# Patient Record
Sex: Male | Born: 1965 | Race: Black or African American | Hispanic: No | Marital: Married | Smoking: Current every day smoker
Health system: Southern US, Community
[De-identification: ages and names within clinical notes are randomized; demographics above are authoritative.]

## PROBLEM LIST (undated history)

## (undated) DIAGNOSIS — C801 Malignant (primary) neoplasm, unspecified: Secondary | ICD-10-CM

## (undated) DIAGNOSIS — K219 Gastro-esophageal reflux disease without esophagitis: Secondary | ICD-10-CM

---

## 1999-05-28 ENCOUNTER — Encounter: Payer: Self-pay | Admitting: Urology

## 1999-05-28 ENCOUNTER — Encounter: Admission: RE | Admit: 1999-05-28 | Discharge: 1999-05-28 | Payer: Self-pay | Admitting: Urology

## 2006-08-15 ENCOUNTER — Ambulatory Visit (HOSPITAL_BASED_OUTPATIENT_CLINIC_OR_DEPARTMENT_OTHER): Admission: RE | Admit: 2006-08-15 | Discharge: 2006-08-15 | Payer: Self-pay | Admitting: Orthopaedic Surgery

## 2008-02-12 ENCOUNTER — Emergency Department (HOSPITAL_COMMUNITY): Admission: EM | Admit: 2008-02-12 | Discharge: 2008-02-12 | Payer: Self-pay | Admitting: Emergency Medicine

## 2010-03-04 ENCOUNTER — Ambulatory Visit: Payer: Self-pay | Admitting: Family Medicine

## 2010-05-04 ENCOUNTER — Ambulatory Visit: Admit: 2010-05-04 | Payer: Self-pay | Admitting: Family Medicine

## 2010-09-17 NOTE — Op Note (Signed)
Corey Paul, Corey Paul                 ACCOUNT NO.:  [id number]   MEDICAL RECORD NO.:  [id number]          PATIENT TYPE:  AMB   LOCATION:  DSC                          FACILITY:  [facility]   PHYSICIAN:  Sharolyn Douglas, M.D.        DATE OF BIRTH:  July 16, 1965   DATE OF PROCEDURE:  08/15/2006  DATE OF DISCHARGE:                               OPERATIVE REPORT   DIAGNOSIS:  L5-S1 herniated nucleus pulposus and degenerative disk  disease with chronic back and left leg pain.   PROCEDURE:  1. Bilateral L5-S1 facette joint injections.  2. Left L5-S1 transforaminal epidural steroid injection.  3. Fluoroscopic imaging used for needle placement of the above      injections.   SURGEON:  Sharolyn Douglas, MD   ASSISTANT:  None.   ANESTHESIA:  MAC plus local.   COMPLICATIONS:  None.   INDICATIONS:  The patient is a pleasant 45 year old male with history of  HNP at L5-S1.  He has improved left lower extremity radiculopathy with  persistent back and buttock pain.  Now presents for diagnostic  potentially therapeutic injections as mentioned above.  Risk, benefits,  alternatives reviewed.   PROCEDURE:  After informed consent taken to the operating room.  He was  turned prone.  He underwent sedation by anesthesia.  Fluoroscopy brought  into the field.  The L5-S1 facette joints were visualized.  22 gauge  Quincke spinal needles were advanced into the L5-S1 facette joints  bilaterally.  Aspiration showed no blood.  Injection of 40 mg Depo-  Medrol and 1 mL preservative-free 1% lidocaine into each facette joint.  We then performed a left L5-S1 transforaminal epidural steroid  injection.  Oblique imaging was obtained of the L5-S1 segment.  22 gauge  Quincke spinal needle was advanced in the posterior oblique fashion  underneath the left L5 pedicle.  Aspiration showed no blood or CSF,  small injection of Omnipaque showed some spread along the epidural  space.  We then injected a solution of 80 mg Depo-Medrol and 3 mL  1%  preservative-free lidocaine.  The patient tolerated the procedure well.  No apparent complications.  Transferred to recovery in stable condition.  I reviewed post injection instructions with him.  He will follow-up in 2-  3 weeks.      Sharolyn Douglas, M.D.  Electronically Signed     MC/MEDQ  D:  08/15/2006  T:  08/15/2006  Job:  [id number]

## 2011-02-02 ENCOUNTER — Inpatient Hospital Stay (INDEPENDENT_AMBULATORY_CARE_PROVIDER_SITE_OTHER)
Admission: RE | Admit: 2011-02-02 | Discharge: 2011-02-02 | Disposition: A | Discharge status: Home / Self Care | Payer: Worker's Compensation | Source: Ambulatory Visit | Attending: Family Medicine | Admitting: Family Medicine

## 2011-02-02 DIAGNOSIS — M779 Enthesopathy, unspecified: Secondary | ICD-10-CM

## 2011-02-02 DIAGNOSIS — IMO0002 Reserved for concepts with insufficient information to code with codable children: Secondary | ICD-10-CM

## 2011-02-21 ENCOUNTER — Encounter: Payer: Self-pay | Admitting: Family Medicine

## 2012-06-19 ENCOUNTER — Telehealth: Payer: Self-pay | Admitting: Pediatrics

## 2012-06-19 NOTE — Telephone Encounter (Signed)
WRONG PATIENT

## 2012-06-19 NOTE — Telephone Encounter (Signed)
Been on meds for 10 days for strep throat has finished the meds and still says throat is sore and has a headache. Mom would

## 2013-11-13 ENCOUNTER — Encounter: Payer: Self-pay | Admitting: Family Medicine

## 2013-11-13 ENCOUNTER — Ambulatory Visit (INDEPENDENT_AMBULATORY_CARE_PROVIDER_SITE_OTHER): Payer: BC Managed Care – PPO | Admitting: Family Medicine

## 2013-11-13 ENCOUNTER — Ambulatory Visit: Payer: Self-pay | Admitting: Family Medicine

## 2013-11-13 VITALS — BP 122/84 | HR 109 | Wt 179.0 lb

## 2013-11-13 DIAGNOSIS — L723 Sebaceous cyst: Secondary | ICD-10-CM

## 2013-11-13 DIAGNOSIS — L259 Unspecified contact dermatitis, unspecified cause: Secondary | ICD-10-CM

## 2013-11-13 NOTE — Progress Notes (Signed)
   Subjective:    Patient ID: Corey Paul, male    DOB: 06/15/1965, 48 y.o.   MRN: [id number]  HPI He is here for evaluation of a lesion in his right axilla that has been there for several years. He also has a one-week history of palms and soles itching. Apparently this occurred after he did some yard work and was wearing flip-flops but no gloves.   Review of Systems     Objective:   Physical Exam Alert and in no distress. A 1-1/2 cm round smooth movable lesion is noted in the right axilla. Exam of his hands and feet does show a papular rash present on the dorsal and palmar surface of the hand. He also has a papular rash present on the dorsal aspect of the foot and questionably on the plantar surface. Lesion on the foot has a line of demarcation essentially lung the same line as his flip-flops.       Assessment & Plan:  Contact dermatitis  Sebaceous cyst  I explained that the rash on his foot was definitive in terms of the well demarcated area of the rash where the flip-flops did not cover. Recommend cortisone cream.

## 2013-11-13 NOTE — Patient Instructions (Signed)
Use cortisone cream on the rash.

## 2013-11-18 ENCOUNTER — Telehealth: Payer: Self-pay | Admitting: Internal Medicine

## 2013-11-18 MED ORDER — TRIAMCINOLONE ACETONIDE 0.1 % EX CREA: 1.0000 "application " | TOPICAL_CREAM | Freq: Two times a day (BID) | CUTANEOUS | Status: DC

## 2013-11-18 NOTE — Telephone Encounter (Signed)
Pt is still having itching and rash is still there. Pt has been using OTC hydrocortisone cream, and bendrayl like Dr. Redmond School recommended. Pharmacy is [facility] and [address] [facility]

## 2013-11-18 NOTE — Telephone Encounter (Signed)
Not getting any better. Still on hands and feet. Will send in med

## 2013-11-18 NOTE — Telephone Encounter (Signed)
Wife notified.

## 2013-11-18 NOTE — Telephone Encounter (Signed)
Confirm that rash has not spread (just hasn't gotten better--that it isn't spreading to new locations.  If still limited to the areas seen on recent visit, then okay for triamcinolone 0.1% cream, apply sparingly to affected areas of skin BID.  30 grams, no refill

## 2014-11-21 ENCOUNTER — Ambulatory Visit (INDEPENDENT_AMBULATORY_CARE_PROVIDER_SITE_OTHER): Payer: BLUE CROSS/BLUE SHIELD | Admitting: Family Medicine

## 2014-11-21 ENCOUNTER — Encounter: Payer: Self-pay | Admitting: Family Medicine

## 2014-11-21 VITALS — BP 130/80 | HR 94 | Wt 186.0 lb

## 2014-11-21 DIAGNOSIS — L089 Local infection of the skin and subcutaneous tissue, unspecified: Secondary | ICD-10-CM

## 2014-11-21 DIAGNOSIS — L723 Sebaceous cyst: Secondary | ICD-10-CM

## 2014-11-21 MED ORDER — HYDROCODONE-ACETAMINOPHEN 5-325 MG PO TABS: 1.0000 | ORAL_TABLET | Freq: Four times a day (QID) | ORAL | Status: DC | PRN

## 2014-11-21 NOTE — Patient Instructions (Signed)
Tryto leave the packing in for at least 2 days then remove it and irrigate it in the shower several times per day

## 2014-11-21 NOTE — Progress Notes (Signed)
   Subjective:    Patient ID: Corey Paul, male    DOB: 04/08/1966, 49 y.o.   MRN: [id number]  HPI  He is here for evaluation of a swollen and painful lesion in his right axilla. He has had a lump in that area for several years but in the last several days it has gotten larger and tender.   Review of Systems     Objective:   Physical Exam  alert and in no distress. Exam of the right axilla does show a 2 x 3 cm fluctuant tender lesion       Assessment & Plan:  Infected sebaceous cyst - Plan: HYDROcodone-acetaminophen (NORCO/VICODIN) 5-325 MG per tablet  wound was injected with Xylocaine. An I+ D  Was performed on it. Apparently material was removed. The cyst sac was also identified and manually removed . The wound did appear clear after that. It was packed with iodoform. They are to try to leave the packing in for 2 days and then remove it and irrigate the area.

## 2015-09-18 ENCOUNTER — Telehealth: Payer: Self-pay

## 2015-09-18 NOTE — Telephone Encounter (Signed)
Error

## 2016-08-30 ENCOUNTER — Ambulatory Visit (INDEPENDENT_AMBULATORY_CARE_PROVIDER_SITE_OTHER): Payer: BLUE CROSS/BLUE SHIELD | Admitting: Family Medicine

## 2016-08-30 ENCOUNTER — Encounter: Payer: Self-pay | Admitting: Family Medicine

## 2016-08-30 VITALS — BP 138/88 | HR 72 | Temp 97.7°F | Wt 182.0 lb

## 2016-08-30 DIAGNOSIS — J309 Allergic rhinitis, unspecified: Secondary | ICD-10-CM | POA: Diagnosis not present

## 2016-08-30 DIAGNOSIS — H9202 Otalgia, left ear: Secondary | ICD-10-CM

## 2016-08-30 DIAGNOSIS — F17290 Nicotine dependence, other tobacco product, uncomplicated: Secondary | ICD-10-CM | POA: Diagnosis not present

## 2016-08-30 NOTE — Patient Instructions (Signed)
Call 800 quit now Right down your plans for what you're going to do instead of smoking a cigar and carry them with you Try loratadine and also Rhinocort nasal spray

## 2016-08-30 NOTE — Progress Notes (Signed)
   Subjective:    Patient ID: Corey Paul, male    DOB: 1965/07/12, 51 y.o.   MRN: [id number]  HPI He is here for evaluation of a 6 week history of left ear congestion as well as some pain and occasional dizziness. No fever, chills, sore throat, cough or chest congestion PND. He does complain of some sneezing as well as itchy watery eyes. He does note that when he yawns and pops his ears, the left ear symptoms do tend to get better. He does smoke cigars and plans to quit when he has his next birthday. He apparently smokes one cigar a day.   Review of Systems     Objective:   Physical Exam Alert and in no distress. Tympanic membranes and canals are normal. Pharyngeal area is normal. Neck is supple without adenopathy or thyromegaly. Cardiac exam shows a regular sinus rhythm without murmurs or gallops. Lungs are clear to auscultation.        Assessment & Plan:  Allergic rhinitis, unspecified seasonality, unspecified trigger  Left ear pain  Cigar smoker motivated to quit I explained that I thought ear pain was related to allergies and eustachian tube dysfunction. Recommend using an antihistamine as well as Rhinocort or Flonase nasal spray. He will let me know how this works and reevaluate at that point or possibly refer to ENT. I then discussed stopping his cigar smoking. 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 Explained the fact that he needs to look at when he smokes a cigar and come up with positive things to do instead of that. Recommend he call 800 quit now. Return here as needed. Over 25 minutes, greater than 50% spent in counseling and coordination of care.

## 2018-07-18 ENCOUNTER — Other Ambulatory Visit: Payer: Self-pay | Admitting: Family Medicine

## 2018-07-18 ENCOUNTER — Other Ambulatory Visit: Payer: Self-pay

## 2018-07-18 ENCOUNTER — Ambulatory Visit: Payer: Self-pay

## 2018-07-18 DIAGNOSIS — M542 Cervicalgia: Secondary | ICD-10-CM

## 2019-03-11 ENCOUNTER — Other Ambulatory Visit: Payer: Self-pay

## 2019-03-11 DIAGNOSIS — Z20822 Contact with and (suspected) exposure to covid-19: Secondary | ICD-10-CM

## 2019-03-12 LAB — NOVEL CORONAVIRUS, NAA: SARS-CoV-2, NAA: NOT DETECTED
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
## 2021-07-12 DIAGNOSIS — E7800 Pure hypercholesterolemia, unspecified: Secondary | ICD-10-CM | POA: Diagnosis not present

## 2021-10-18 DIAGNOSIS — N400 Enlarged prostate without lower urinary tract symptoms: Secondary | ICD-10-CM | POA: Diagnosis not present

## 2021-10-25 DIAGNOSIS — R972 Elevated prostate specific antigen [PSA]: Secondary | ICD-10-CM | POA: Diagnosis not present

## 2021-10-25 DIAGNOSIS — N400 Enlarged prostate without lower urinary tract symptoms: Secondary | ICD-10-CM | POA: Diagnosis not present

## 2022-02-08 DIAGNOSIS — Z0000 Encounter for general adult medical examination without abnormal findings: Secondary | ICD-10-CM | POA: Diagnosis not present

## 2022-02-08 DIAGNOSIS — E7800 Pure hypercholesterolemia, unspecified: Secondary | ICD-10-CM | POA: Diagnosis not present

## 2022-03-28 DIAGNOSIS — N400 Enlarged prostate without lower urinary tract symptoms: Secondary | ICD-10-CM | POA: Diagnosis not present

## 2022-04-04 DIAGNOSIS — N400 Enlarged prostate without lower urinary tract symptoms: Secondary | ICD-10-CM | POA: Diagnosis not present

## 2022-04-04 DIAGNOSIS — R972 Elevated prostate specific antigen [PSA]: Secondary | ICD-10-CM | POA: Diagnosis not present

## 2022-05-16 DIAGNOSIS — C61 Malignant neoplasm of prostate: Secondary | ICD-10-CM | POA: Diagnosis not present

## 2022-05-16 DIAGNOSIS — R972 Elevated prostate specific antigen [PSA]: Secondary | ICD-10-CM | POA: Diagnosis not present

## 2022-05-23 DIAGNOSIS — C61 Malignant neoplasm of prostate: Secondary | ICD-10-CM | POA: Diagnosis not present

## 2022-05-25 ENCOUNTER — Telehealth: Payer: Self-pay | Admitting: Radiation Oncology

## 2022-05-25 NOTE — Telephone Encounter (Signed)
LVM to schedule CON with Dr. Tammi Klippel

## 2022-06-06 ENCOUNTER — Encounter: Payer: Self-pay | Admitting: Urology

## 2022-06-06 DIAGNOSIS — C61 Malignant neoplasm of prostate: Secondary | ICD-10-CM | POA: Insufficient documentation

## 2022-06-06 NOTE — Progress Notes (Signed)
GU Location of Tumor / Histology: Prostate Ca  If Prostate Cancer, Gleason Score is (3 + 4) and PSA is (5.42 on 03/2022)  Biopsies     Past/Anticipated interventions by urology, if any:     Past/Anticipated interventions by medical oncology, if any:  NA  Weight changes, if any: {:18581}  IPSS: SHIM:  Bowel/Bladder complaints, if any: {:18581}   Nausea/Vomiting, if any: {:18581}  Pain issues, if any:  {:18581}  SAFETY ISSUES: Prior radiation? {:18581} Pacemaker/ICD? {:18581} Possible current pregnancy? Male Is the patient on methotrexate? No  Current Complaints / other details:

## 2022-06-06 NOTE — Progress Notes (Signed)
Radiation Oncology         [phone] ________________________________  Initial Outpatient Consultation  Name: Corey Paul MRN: [id number]  Date: 06/07/2022  DOB: [birth year]  OA:[id number], Elyse Jarvis, MD  Remi Haggard, MD   REFERRING PHYSICIAN: Remi Haggard, MD  DIAGNOSIS: 57 y.o. gentleman with Stage T1c adenocarcinoma of the prostate with Gleason score of 3+4, and PSA of 5.4.  No diagnosis found.  HISTORY OF PRESENT ILLNESS: Corey Paul is a 57 y.o. male with a diagnosis of prostate cancer. He was initially referred to Dr. Milford Cage in mid 2022 for a marginally elevated PSA of 4.43. His PSA subsequently normalized to 2.56 in 04/2021. Unfortunately, his PSA began to rise again, to 3.67 in 09/2021 and 5.42 in 03/2022. The patient proceeded to transrectal ultrasound with 12 biopsies of the prostate on 05/16/22.  The prostate volume measured 36.75 cc.  Out of 12 core biopsies, 5 were positive.  The maximum Gleason score was 3+4, and this was seen in left base lateral. Additionally, small foci of Gleason 3+3 were seen in right mid lateral, right base lateral, left mid lateral, and left base.  The patient reviewed the biopsy results with his urologist and he has kindly been referred today for discussion of potential radiation treatment options.   PREVIOUS RADIATION THERAPY: No  PAST MEDICAL HISTORY: No past medical history on file.    PAST SURGICAL HISTORY:No past surgical history on file.  FAMILY HISTORY: No family history on file.  SOCIAL HISTORY:  Social History   Socioeconomic History   Marital status: Married    Spouse name: Not on file   Number of children: Not on file   Years of education: Not on file   Highest education level: Not on file  Occupational History   Not on file  Tobacco Use   Smoking status: Light Smoker   Smokeless tobacco: Not on file  Substance and Sexual Activity   Alcohol use: Not on file   Drug use: Not on file   Sexual activity: Not on file   Other Topics Concern   Not on file  Social History Narrative   Not on file   Social Determinants of Health   Financial Resource Strain: Not on file  Food Insecurity: Not on file  Transportation Needs: Not on file  Physical Activity: Not on file  Stress: Not on file  Social Connections: Not on file  Intimate Partner Violence: Not on file    ALLERGIES: Patient has no known allergies.  MEDICATIONS:  Current Outpatient Medications  Medication Sig Dispense Refill   HYDROcodone-acetaminophen (NORCO/VICODIN) 5-325 MG per tablet Take 1 tablet by mouth every 6 (six) hours as needed for moderate pain. ONLY PRN 20 tablet 0   methocarbamol (ROBAXIN) 500 MG tablet Take 500 mg by mouth every 8 (eight) hours as needed for muscle spasms. ONLY PRN     Multiple Vitamins-Minerals (MULTIVITAMIN WITH MINERALS) tablet Take 1 tablet by mouth daily.     TRAZODONE HCL PO Take by mouth.     No current facility-administered medications for this encounter.    REVIEW OF SYSTEMS:  On review of systems, the patient reports that he is doing well overall. He denies any chest pain, shortness of breath, cough, fevers, chills, night sweats, unintended weight changes. He denies any bowel disturbances, and denies abdominal pain, nausea or vomiting. He denies any new musculoskeletal or joint aches or pains. His IPSS was ***, indicating *** urinary symptoms. His SHIM was ***,  indicating he {does not have/has mild/moderate/severe} erectile dysfunction. A complete review of systems is obtained and is otherwise negative.    PHYSICAL EXAM:  Wt Readings from Last 3 Encounters:  08/30/16 182 lb (82.6 kg)  11/21/14 186 lb (84.4 kg)  11/13/13 179 lb (81.2 kg)   Temp Readings from Last 3 Encounters:  08/30/16 97.7 F (36.5 C) (Oral)   BP Readings from Last 3 Encounters:  08/30/16 138/88  11/21/14 130/80  11/13/13 122/84   Pulse Readings from Last 3 Encounters:  08/30/16 72  11/21/14 94  11/13/13 (!) 109     /10  In general this is a well appearing *** male in no acute distress. He's alert and oriented x4 and appropriate throughout the examination. Cardiopulmonary assessment is negative for acute distress, and he exhibits normal effort.     KPS = ***  100 - Normal; no complaints; no evidence of disease. 90   - Able to carry on normal activity; minor signs or symptoms of disease. 80   - Normal activity with effort; some signs or symptoms of disease. 100   - Cares for self; unable to carry on normal activity or to do active work. 60   - Requires occasional assistance, but is able to care for most of his personal needs. 50   - Requires considerable assistance and frequent medical care. 55   - Disabled; requires special care and assistance. 2   - Severely disabled; hospital admission is indicated although death not imminent. 78   - Very sick; hospital admission necessary; active supportive treatment necessary. 10   - Moribund; fatal processes progressing rapidly. 0     - Dead  Karnofsky DA, Abelmann WH, Craver LS and Burchenal JH [phone]) The use of the nitrogen mustards in the palliative treatment of carcinoma: with particular reference to bronchogenic carcinoma Cancer 1 634-56  LABORATORY DATA:  No results found for: "WBC", "HGB", "HCT", "MCV", "PLT" No results found for: "NA", "K", "CL", "CO2" No results found for: "ALT", "AST", "GGT", "ALKPHOS", "BILITOT"   RADIOGRAPHY: No results found.    IMPRESSION/PLAN: 1. 57 y.o. gentleman with Stage T1c adenocarcinoma of the prostate with Gleason Score of 3+4, and PSA of 5.4. We discussed the patient's workup and outlined the nature of prostate cancer in this setting. The patient's T stage, Gleason's score, and PSA put him into the favorable intermediate risk group. Accordingly, he is eligible for a variety of potential treatment options including brachytherapy, 5.5 weeks of external radiation, or prostatectomy. We discussed the available radiation  techniques, and focused on the details and logistics of delivery. We discussed and outlined the risks, benefits, short and long-term effects associated with radiotherapy and compared and contrasted these with prostatectomy. We discussed the role of SpaceOAR gel in reducing the rectal toxicity associated with radiotherapy. He appears to have a good understanding of his disease and our treatment recommendations which are of curative intent.  He was encouraged to ask questions that were answered to his stated satisfaction.  At the conclusion of our conversation, the patient is interested in moving forward with ***.  We personally spent *** minutes in this encounter including chart review, reviewing radiological studies, meeting face-to-face with the patient, entering orders and completing documentation.    Nicholos Johns, PA-C    Tyler Pita, MD  [facility] Direct Dial: [phone]  Fax: [phone] [email or web address]  Skype  [email or web address]   This document serves as a record of services personally performed by Rodman Key  Tammi Klippel, MD and [employer], PA-C. It was created on their behalf by Wilburn Mylar, a trained medical scribe. The creation of this record is based on the scribe's personal observations and the provider's statements to them. This document has been checked and approved by the attending provider.

## 2022-06-07 ENCOUNTER — Ambulatory Visit
Admission: RE | Admit: 2022-06-07 | Discharge: 2022-06-07 | Disposition: A | Discharge status: Home / Self Care | Payer: BC Managed Care – PPO | Source: Ambulatory Visit | Attending: Radiation Oncology | Admitting: Radiation Oncology

## 2022-06-07 ENCOUNTER — Other Ambulatory Visit: Payer: Self-pay

## 2022-06-07 VITALS — BP 131/92 | HR 85 | Temp 97.9°F | Resp 20 | Ht 68.0 in | Wt 191.4 lb

## 2022-06-07 DIAGNOSIS — Z79899 Other long term (current) drug therapy: Secondary | ICD-10-CM | POA: Diagnosis not present

## 2022-06-07 DIAGNOSIS — C61 Malignant neoplasm of prostate: Secondary | ICD-10-CM

## 2022-06-07 DIAGNOSIS — Z191 Hormone sensitive malignancy status: Secondary | ICD-10-CM | POA: Diagnosis not present

## 2022-06-07 NOTE — Progress Notes (Signed)
Introduced myself to the patient, and his wife, as the prostate nurse navigator.  He is here to discuss his radiation treatment options. Patient has a surgical consult on 2/20 with Dr. Jiles Prows patient is agreeable for me to follow up after to ensure treatment decision is finalized.  I gave him my business card and asked him to call me with questions or concerns.  Verbalized understanding.

## 2022-06-23 NOTE — Progress Notes (Signed)
Patient with RadOnc Consult on 06/07/2022 for his stage T1c adenocarcinoma of the prostate with Gleason score of 3+4, and PSA of 5.4.   Patient was scheduled for surgical consult @ [facility] on 2/20, however, did not show for appointment.   RN left voicemail requesting call back to assess navigation needs/treatment decision.

## 2022-06-27 NOTE — Progress Notes (Signed)
Patient confirmed he would like to proceed with the recommendations of 5.5 weeks of IMRT.  MD's notified.  RN educated patient on next steps consisting of obtaining date for fiducial's, spaceOAR, and CT Simulation.  Verbalized understanding.   Plan of care in progress.

## 2022-06-28 ENCOUNTER — Other Ambulatory Visit: Payer: Self-pay | Admitting: Urology

## 2022-07-11 DIAGNOSIS — C61 Malignant neoplasm of prostate: Secondary | ICD-10-CM | POA: Diagnosis not present

## 2022-07-18 ENCOUNTER — Encounter (HOSPITAL_BASED_OUTPATIENT_CLINIC_OR_DEPARTMENT_OTHER): Payer: Self-pay | Admitting: Urology

## 2022-07-18 NOTE — Progress Notes (Signed)
Spoke w/ via phone for pre-op interview--- [place]---- NONE              Lab results------ COVID test -----patient states asymptomatic no test needed Arrive at -------0600 NPO after MN NO Solid Food.  Clear liquids from MN until---0500 Med rec completed Medications to take morning of surgery -----Nexium and Norco PRN Diabetic medication ----- Patient instructed no nail polish to be worn day of surgery Patient instructed to bring photo id and insurance card day of surgery Patient aware to have Driver (ride ) / caregiver  Wife Aayam Tiscareno  for 24 hours after surgery  Patient Special Instructions ----- Pre-Op special Istructions -----FLEETS enema AM of procedure patient verbalized understanding. Patient verbalized understanding of instructions that were given at this phone interview. Patient denies shortness of breath, chest pain, fever, cough at this phone interview.

## 2022-07-22 ENCOUNTER — Ambulatory Visit (HOSPITAL_BASED_OUTPATIENT_CLINIC_OR_DEPARTMENT_OTHER): Payer: BC Managed Care – PPO | Admitting: Anesthesiology

## 2022-07-22 ENCOUNTER — Encounter (HOSPITAL_BASED_OUTPATIENT_CLINIC_OR_DEPARTMENT_OTHER)
Admission: RE | Disposition: A | Discharge status: Home / Self Care | Payer: Self-pay | Source: Home / Self Care | Attending: Urology

## 2022-07-22 ENCOUNTER — Ambulatory Visit (HOSPITAL_BASED_OUTPATIENT_CLINIC_OR_DEPARTMENT_OTHER)
Admission: RE | Admit: 2022-07-22 | Discharge: 2022-07-22 | Disposition: A | Discharge status: Home / Self Care | Payer: BC Managed Care – PPO | Attending: Urology | Admitting: Urology

## 2022-07-22 ENCOUNTER — Encounter (HOSPITAL_BASED_OUTPATIENT_CLINIC_OR_DEPARTMENT_OTHER): Payer: Self-pay | Admitting: Urology

## 2022-07-22 ENCOUNTER — Other Ambulatory Visit: Payer: Self-pay

## 2022-07-22 DIAGNOSIS — C61 Malignant neoplasm of prostate: Secondary | ICD-10-CM | POA: Diagnosis not present

## 2022-07-22 DIAGNOSIS — Z8546 Personal history of malignant neoplasm of prostate: Secondary | ICD-10-CM | POA: Diagnosis not present

## 2022-07-22 DIAGNOSIS — F17200 Nicotine dependence, unspecified, uncomplicated: Secondary | ICD-10-CM | POA: Diagnosis not present

## 2022-07-22 DIAGNOSIS — E785 Hyperlipidemia, unspecified: Secondary | ICD-10-CM | POA: Diagnosis not present

## 2022-07-22 HISTORY — DX: Malignant (primary) neoplasm, unspecified: C80.1

## 2022-07-22 HISTORY — DX: Gastro-esophageal reflux disease without esophagitis: K21.9

## 2022-07-22 HISTORY — PX: SPACE OAR INSTILLATION: SHX6769

## 2022-07-22 HISTORY — PX: GOLD SEED IMPLANT: SHX6343

## 2022-07-22 SURGERY — INSERTION, GOLD SEEDS
Anesthesia: Monitor Anesthesia Care | Site: Prostate

## 2022-07-22 MED ORDER — CIPROFLOXACIN IN D5W 400 MG/200ML IV SOLN
INTRAVENOUS | Status: AC
  Filled 2022-07-22: qty 200

## 2022-07-22 MED ORDER — ACETAMINOPHEN 500 MG PO TABS
ORAL_TABLET | ORAL | Status: AC
  Filled 2022-07-22: qty 2

## 2022-07-22 MED ORDER — LIDOCAINE HCL 1 % IJ SOLN
INTRAMUSCULAR | Status: DC | PRN
  Administered 2022-07-22: 10 mL

## 2022-07-22 MED ORDER — FLEET ENEMA 7-19 GM/118ML RE ENEM: 1.0000 | ENEMA | Freq: Once | RECTAL | Status: DC

## 2022-07-22 MED ORDER — MIDAZOLAM HCL 5 MG/5ML IJ SOLN
INTRAMUSCULAR | Status: DC | PRN
  Administered 2022-07-22: 2 mg via INTRAVENOUS

## 2022-07-22 MED ORDER — ONDANSETRON HCL 4 MG/2ML IJ SOLN
INTRAMUSCULAR | Status: DC | PRN
  Administered 2022-07-22: 4 mg via INTRAVENOUS

## 2022-07-22 MED ORDER — PROPOFOL 10 MG/ML IV BOLUS
INTRAVENOUS | Status: AC
  Filled 2022-07-22: qty 20

## 2022-07-22 MED ORDER — PROPOFOL 500 MG/50ML IV EMUL
INTRAVENOUS | Status: AC
  Filled 2022-07-22: qty 50

## 2022-07-22 MED ORDER — ACETAMINOPHEN 500 MG PO TABS
1000.0000 mg | ORAL_TABLET | Freq: Once | ORAL | Status: AC
  Administered 2022-07-22: 1000 mg via ORAL

## 2022-07-22 MED ORDER — FENTANYL CITRATE (PF) 100 MCG/2ML IJ SOLN
INTRAMUSCULAR | Status: AC
  Filled 2022-07-22: qty 2

## 2022-07-22 MED ORDER — PROPOFOL 10 MG/ML IV BOLUS
INTRAVENOUS | Status: DC | PRN
  Administered 2022-07-22: 200 ug/kg/min via INTRAVENOUS
  Administered 2022-07-22 (×2): 20 mg via INTRAVENOUS

## 2022-07-22 MED ORDER — MIDAZOLAM HCL 2 MG/2ML IJ SOLN
INTRAMUSCULAR | Status: AC
  Filled 2022-07-22: qty 2

## 2022-07-22 MED ORDER — CIPROFLOXACIN IN D5W 400 MG/200ML IV SOLN
400.0000 mg | INTRAVENOUS | Status: AC
  Administered 2022-07-22: 400 mg via INTRAVENOUS

## 2022-07-22 MED ORDER — GLYCOPYRROLATE PF 0.2 MG/ML IJ SOSY
PREFILLED_SYRINGE | INTRAMUSCULAR | Status: DC | PRN
  Administered 2022-07-22: .1 mg via INTRAVENOUS

## 2022-07-22 MED ORDER — FENTANYL CITRATE (PF) 100 MCG/2ML IJ SOLN
INTRAMUSCULAR | Status: DC | PRN
  Administered 2022-07-22: 50 ug via INTRAVENOUS

## 2022-07-22 MED ORDER — DEXMEDETOMIDINE HCL IN NACL 200 MCG/50ML IV SOLN
INTRAVENOUS | Status: DC | PRN
  Administered 2022-07-22: 8 ug via INTRAVENOUS

## 2022-07-22 MED ORDER — GLYCOPYRROLATE PF 0.2 MG/ML IJ SOSY
PREFILLED_SYRINGE | INTRAMUSCULAR | Status: AC
  Filled 2022-07-22: qty 1

## 2022-07-22 MED ORDER — FENTANYL CITRATE (PF) 100 MCG/2ML IJ SOLN: 25.0000 ug | INTRAMUSCULAR | Status: DC | PRN

## 2022-07-22 MED ORDER — LACTATED RINGERS IV SOLN
INTRAVENOUS | Status: DC
  Administered 2022-07-22: 1000 mL via INTRAVENOUS

## 2022-07-22 MED ORDER — SODIUM CHLORIDE (PF) 0.9 % IJ SOLN
INTRAMUSCULAR | Status: DC | PRN
  Administered 2022-07-22: 10 mL

## 2022-07-22 SURGICAL SUPPLY — 25 items
BLADE CLIPPER SENSICLIP SURGIC (BLADE) ×2 IMPLANT
CNTNR URN SCR LID CUP LEK RST (MISCELLANEOUS) ×2 IMPLANT
CONT SPEC 4OZ STRL OR WHT (MISCELLANEOUS) ×2
COVER BACK TABLE 60X90IN (DRAPES) ×2 IMPLANT
DRSG TEGADERM 4X4.75 (GAUZE/BANDAGES/DRESSINGS) ×2 IMPLANT
DRSG TEGADERM 8X12 (GAUZE/BANDAGES/DRESSINGS) ×2 IMPLANT
GAUZE SPONGE 4X4 12PLY STRL (GAUZE/BANDAGES/DRESSINGS) ×2 IMPLANT
GLOVE BIO SURGEON STRL SZ7.5 (GLOVE) ×2 IMPLANT
GLOVE SURG ORTHO 8.5 STRL (GLOVE) ×2 IMPLANT
IMPL SPACEOAR VUE SYSTEM (Spacer) ×2 IMPLANT
IMPLANT SPACEOAR VUE SYSTEM (Spacer) ×2 IMPLANT
KIT TURNOVER CYSTO (KITS) ×2 IMPLANT
MARKER GOLD PRELOAD 1.2X3 (Urological Implant) ×2 IMPLANT
MARKER SKIN DUAL TIP RULER LAB (MISCELLANEOUS) ×2 IMPLANT
NDL SPNL 22GX3.5 QUINCKE BK (NEEDLE) ×2 IMPLANT
NEEDLE SPNL 22GX3.5 QUINCKE BK (NEEDLE) ×2 IMPLANT
SEED GOLD PRELOAD 1.2X3 (Urological Implant) ×2 IMPLANT
SHEATH ULTRASOUND LF (SHEATH) IMPLANT
SHEATH ULTRASOUND LTX NONSTRL (SHEATH) IMPLANT
SLEEVE SCD COMPRESS KNEE MED (STOCKING) ×2 IMPLANT
SURGILUBE 2OZ TUBE FLIPTOP (MISCELLANEOUS) ×2 IMPLANT
SYR 10ML LL (SYRINGE) ×2 IMPLANT
SYR CONTROL 10ML LL (SYRINGE) ×2 IMPLANT
TOWEL OR 17X24 6PK STRL BLUE (TOWEL DISPOSABLE) ×2 IMPLANT
UNDERPAD 30X36 HEAVY ABSORB (UNDERPADS AND DIAPERS) ×2 IMPLANT

## 2022-07-22 NOTE — Discharge Instructions (Addendum)
For several days the patient:  should increase his fluid intake and limit strenuous activity. he might have mild discomfort at the base of his penis or in his rectum. he might have blood in his urine or blood in his bowel movements.  For 2-3 months he might have blood in his ejaculate (semen).  Call the office immedicately:  for blood clots in the urine or bowel movements,  difficulty urinating,  inability to urinate,  urinary retention,  painful or frequent urination,  fever, chills,  nausea, vomiting, other illness.    [facility]:  [phone]  Post Anesthesia Home Care Instructions  Activity: Get plenty of rest for the remainder of the day. A responsible adult should stay with you for 24 hours following the procedure.  For the next 24 hours, DO NOT: -Drive a car -Paediatric nurse -Drink alcoholic beverages -Take any medication unless instructed by your physician -Make any legal decisions or sign important papers.  Meals: Start with liquid foods such as gelatin or soup. Progress to regular foods as tolerated. Avoid greasy, spicy, heavy foods. If nausea and/or vomiting occur, drink only clear liquids until the nausea and/or vomiting subsides. Call your physician if vomiting continues.  Special Instructions/Symptoms: Your throat may feel dry or sore from the anesthesia or the breathing tube placed in your throat during surgery. If this causes discomfort, gargle with warm salt water. The discomfort should disappear within 24 hours.  May take tylenol after 3pm if needed for discomfort.

## 2022-07-22 NOTE — Op Note (Signed)
Preoperative diagnosis: Clinically localized adenocarcinoma of the prostate    Postoperative diagnosis: Clinically localized adenocarcinoma of the prostate  Procedure: 1) Placement of fiducial markers into prostate                    2) Insertion of SpaceOAR hydrogel   Surgeon: Louis Meckel, M.D.  Anesthesia: General  EBL: Minimal  Complications: None  Indication: Corey Paul is a 57 y.o. gentleman with clinically localized prostate cancer. After discussing management options for treatment, he elected to proceed with radiotherapy. He presents today for the above procedures. The potential risks, complications, alternative options, and expected recovery course have been discussed in detail with the patient and he has provided informed consent to proceed.  Description of procedure: The patient was administered preoperative antibiotics, placed in the dorsal lithotomy position, and prepped and draped in the usual sterile fashion. Next, transrectal ultrasonography was utilized to visualize the prostate. Three gold fiducial markers were then placed into the prostate via transperineal needles under ultrasound guidance at the right apex, right base, and left mid gland under direct ultrasound guidance. A site in the midline was then selected on the perineum for placement of an 18 g needle with saline. The needle was advanced above the rectum and below Denonvillier's fascia to the mid gland and confirmed to be in the midline on transverse imaging. One cc of saline was injected confirming appropriate expansion of this space. A total of 5 cc of saline was then injected to open the space further bilaterally. The saline syringe was then removed and the SpaceOAR hydrogel was injected with good distribution bilaterally. He tolerated the procedure well and without complications. He was given a voiding trial prior to discharge from the PACU.

## 2022-07-22 NOTE — Transfer of Care (Signed)
Immediate Anesthesia Transfer of Care Note  Patient: Corey Paul  Procedure(s) Performed: GOLD SEED IMPLANT (Perineum) SPACE OAR INSTILLATION (Prostate)  Patient Location: PACU  Anesthesia Type:MAC  Level of Consciousness: drowsy, patient cooperative, and responds to stimulation  Airway & Oxygen Therapy: Patient Spontanous Breathing and Patient connected to face mask oxygen  Post-op Assessment: Report given to RN and Post -op Vital signs reviewed and stable  Post vital signs: Reviewed and stable  Last Vitals:  Vitals Value Taken Time  BP 125/91 07/22/22 0902  Temp    Pulse 98 07/22/22 0904  Resp 15 07/22/22 0904  SpO2 100 % 07/22/22 0904  Vitals shown include unvalidated device data.  Last Pain:  Vitals:   07/22/22 0639  TempSrc:   PainSc: 0-No pain      Patients Stated Pain Goal: 5 ([id number])  Complications: No notable events documented.

## 2022-07-22 NOTE — H&P (Signed)
Patient is a healthy 57 year old African American male seen today for evaluation of marginal PSA elevation noted on recent routine physical exam. Patient noted to have PSA of 4.90 on 07/01/2020. This was repeated on 07/14/2020 was 4.43. There is family history of prostate cancer with father being diagnosed in his 41s and underwent treatment with surgery by his report. Patient's father is alive and well. The patient has minimal if any voiding symptoms. Here for evaluation  -04/12/21-patient with history of marginal PSA elevation noted to be 4.43 back in March 2022 we opted for 69-month surveillance. His most recent PSA is now normalized to 2.56 on 04/05/2021. Here to discuss next steps of management no interim GU issues.  Micro urinalysis is clear and urine spun sediment  -10/25/21 patient with history of marginal PSA elevation noted to be 4.90 back in March 2022 and we opted for 29-month surveillance. His most recent PSA is 3.67 on 10/18/2021 up from 2.56 on 04/05/2021. No interim voiding issues.  Micro urinalysis is clear on urine spun sediment  -04/04/22-patient with history of marginal PSA elevation up to 4.90 back in March 22 and we opted for surveillance. Most recent PSA is 5.42 on 03/28/2022. This has been a progressive increase over the last 2 values from 3.67 in June 2023 and up from 2.56 in December 2022. No voiding issues.  Micro urinalysis  -05/16/22-patient with history of marginal PSA elevation up to 5.42. Here for transrectal ultrasound prostate biopsy.  -05/23/22-patient with history of marginal PSA elevation up to 5.42. Underwent transrectal ultrasound prostate biopsy 05/16/2022 which unfortunately shows adenocarcinoma the prostate and 5 out of 12 cores. See below. Patient had no significant postprocedural issues.  TRUS/BX 05/16/22  PATH:  1. Left base lateral prostate adenocarcinoma, Gleason score 3+4 equal 7 (grade group 2) involves 10% of 1 core  2. Left mid lateral small focus prostate  adenocarcinoma, Gleason score 3+3 equal 6 involving 5% of 1 core  3. Left base small focus prostate adenocarcinoma, Gleason score 3+3 equal 6 involving less than 5% of 1 core  4. Right base lateral small focus prostate adenocarcinoma, Gleason score 3+3 equal 6 involving 5% of 1 core  5. Right mid lateral small focus prostate adenocarcinoma, Gleason score 3+3 equal 6 involving 5% of 1 core  NCCN:Favorable Intermediate   -07/11/22-patient with history of favorable intermediate adenocarcinoma the prostate as above. Met with radiation oncology and opted for IMRT. Now for preop evaluation for fiducial marker and SpaceOAR gel implant. No interim GU issues  Micro urinalysis is clear on urine spun sediment     ALLERGIES: None   MEDICATIONS: Nexium  Valium 10 mg tablet Take 1 tab po 1 hour prior to procedure  Claritin  Hydrocodone-Acetaminophen 5 mg-325 mg tablet  Ibu 800 mg tablet  Methocarbamol  Multivitamin  Trazodone Hcl 50 mg tablet     GU PSH: Prostate Needle Biopsy - 05/16/2022     NON-GU PSH: Anesth, Knee Joint Surgery Surgical Pathology, Gross And Microscopic Examination For Prostate Needle - 05/16/2022     GU PMH: Family Hx of Prostate Cancer - 05/23/2022, - 05/16/2022, - 04/04/2022, - 10/25/2021, - 10/12/2020 Prostate Cancer - 05/23/2022 Elevated PSA - 05/16/2022, - 04/04/2022, - 10/25/2021, - 04/12/2021, - 10/12/2020 BPH w/o LUTS - 04/04/2022, - 10/25/2021, - 04/12/2021, - 10/12/2020    NON-GU PMH: Depression GERD    FAMILY HISTORY: copd - Father Heart Attack - Father Hypertension - Mother, Father kidney stone - Father prostate - Grandfather, Father Thyroid Disease -  Sister, Mother, Father, Daughter   SOCIAL HISTORY: Marital Status: Married Ethnicity: Not Hispanic Or Latino; Race: Black or African American Current Smoking Status: Patient smokes. Has smoked since 09/30/2000.   Tobacco Use Assessment Completed: Used Tobacco in last 30 days? Drinks 2 drinks per week.  Drinks 1  caffeinated drink per day.     Notes: He only smokes cigars   REVIEW OF SYSTEMS:    GU Review Male:   Patient denies frequent urination, hard to postpone urination, burning/ pain with urination, get up at night to urinate, leakage of urine, stream starts and stops, trouble starting your stream, have to strain to urinate , erection problems, and penile pain.  Gastrointestinal (Upper):   Patient denies nausea, vomiting, and indigestion/ heartburn.  Gastrointestinal (Lower):   Patient denies diarrhea and constipation.  Constitutional:   Patient denies fever, night sweats, weight loss, and fatigue.  Skin:   Patient denies skin rash/ lesion and itching.  Eyes:   Patient denies blurred vision and double vision.  Ears/ Nose/ Throat:   Patient denies sore throat and sinus problems.  Hematologic/Lymphatic:   Patient denies swollen glands and easy bruising.  Cardiovascular:   Patient denies leg swelling and chest pains.  Respiratory:   Patient denies cough and shortness of breath.  Endocrine:   Patient denies excessive thirst.  Musculoskeletal:   Patient denies back pain and joint pain.  Neurological:   Patient denies headaches and dizziness.  Psychologic:   Patient denies depression and anxiety.   VITAL SIGNS:      07/11/2022 07:54 AM  BP 150/95 mmHg  Pulse 74 /min  Temperature 98.4 F / 36.8 C   MULTI-SYSTEM PHYSICAL EXAMINATION:    Constitutional: Well-nourished. No physical deformities. Normally developed. Good grooming.  Neck: Neck symmetrical, not swollen. Normal tracheal position.  Respiratory: No labored breathing, no use of accessory muscles.   Cardiovascular: Normal temperature, normal extremity pulses, no swelling, no varicosities.  Lymphatic: No enlargement of neck, axillae, groin.  Skin: No paleness, no jaundice, no cyanosis. No lesion, no ulcer, no rash.  Neurologic / Psychiatric: Oriented to time, oriented to place, oriented to person. No depression, no anxiety, no agitation.   Gastrointestinal: No mass, no tenderness, no rigidity, non obese abdomen.  Eyes: Normal conjunctivae. Normal eyelids.  Ears, Nose, Mouth, and Throat: Left ear no scars, no lesions, no masses. Right ear no scars, no lesions, no masses. Nose no scars, no lesions, no masses. Normal hearing. Normal lips.  Musculoskeletal: Normal gait and station of head and neck.     Complexity of Data:  Source Of History:  Patient  Records Review:   Pathology Reports, Previous Doctor Records, Previous Patient Records  Urine Test Review:   Urinalysis   03/28/22 10/18/21 04/05/21  PSA  Total PSA 5.42 ng/mL 3.67 ng/mL 2.56 ng/mL  Free PSA 0.61 ng/mL    % Free PSA 11 % PSA      PROCEDURES:          Urinalysis - 81003 Dipstick Dipstick Cont'd  Color: Yellow Bilirubin: Neg mg/dL  Appearance: Clear Ketones: Neg mg/dL  Specific Gravity: 1.020 Blood: Neg ery/uL  pH: 6.5 Protein: Neg mg/dL  Glucose: Neg mg/dL Urobilinogen: 0.2 mg/dL    Nitrites: Neg    Leukocyte Esterase: Neg leu/uL    ASSESSMENT:      ICD-10 Details  1 GU:   Prostate Cancer - 0000000 Acute, Complicated Injury   PLAN:           Document Letter(s):  Created for Patient: Clinical Summary         Notes:   Patient scheduled for IMRT for definitive management of his prostate cancer. Scheduled for fiducial marker and SpaceOAR gel implant prior to initiation of IMRT. Risk and benefits of procedure discussed in detail including risk of bleeding, infection, possible injury to prostate or rectum. Patient understands risk desires and agreeable to proceed.

## 2022-07-22 NOTE — Anesthesia Preprocedure Evaluation (Addendum)
Anesthesia Evaluation  Patient identified by MRN, date of birth, ID band Patient awake    Reviewed: Allergy & Precautions, NPO status , Patient's Chart, lab work & pertinent test results  Airway Mallampati: III  TM Distance: >3 FB Neck ROM: Full    Dental  (+) Chipped, Dental Advisory Given,    Pulmonary Current SmokerPatient did not abstain from smoking.   Pulmonary exam normal breath sounds clear to auscultation       Cardiovascular Normal cardiovascular exam Rhythm:Regular Rate:Normal  HLD   Neuro/Psych negative neurological ROS  negative psych ROS   GI/Hepatic Neg liver ROS,GERD  ,,  Endo/Other  negative endocrine ROS    Renal/GU negative Renal ROS  negative genitourinary   Musculoskeletal negative musculoskeletal ROS (+)    Abdominal   Peds  Hematology negative hematology ROS (+)   Anesthesia Other Findings   Reproductive/Obstetrics                             Anesthesia Physical Anesthesia Plan  ASA: 2  Anesthesia Plan: MAC   Post-op Pain Management: Tylenol PO (pre-op)*   Induction: Intravenous  PONV Risk Score and Plan: 0 and Propofol infusion, Treatment may vary due to age or medical condition, Ondansetron, Dexamethasone and Midazolam  Airway Management Planned: Natural Airway  Additional Equipment:   Intra-op Plan:   Post-operative Plan:   Informed Consent: I have reviewed the patients History and Physical, chart, labs and discussed the procedure including the risks, benefits and alternatives for the proposed anesthesia with the patient or authorized representative who has indicated his/her understanding and acceptance.     Dental advisory given  Plan Discussed with: CRNA  Anesthesia Plan Comments:        Anesthesia Quick Evaluation

## 2022-07-22 NOTE — Anesthesia Postprocedure Evaluation (Signed)
Anesthesia Post Note  Patient: Corey Paul  Procedure(s) Performed: GOLD SEED IMPLANT (Perineum) SPACE OAR INSTILLATION (Prostate)     Patient location during evaluation: PACU Anesthesia Type: MAC Level of consciousness: awake and alert Pain management: pain level controlled Vital Signs Assessment: post-procedure vital signs reviewed and stable Respiratory status: spontaneous breathing, nonlabored ventilation, respiratory function stable and patient connected to nasal cannula oxygen Cardiovascular status: stable and blood pressure returned to baseline Postop Assessment: no apparent nausea or vomiting Anesthetic complications: no  No notable events documented.  Last Vitals:  Vitals:   07/22/22 0930 07/22/22 0932  BP:  115/87  Pulse: 78 77  Resp: 16 16  Temp:    SpO2: 98% 98%    Last Pain:  Vitals:   07/22/22 0932  TempSrc:   PainSc: 0-No pain                 Larayne Baxley L Marven Veley

## 2022-07-22 NOTE — Interval H&P Note (Signed)
History and Physical Interval Note:  07/22/2022 8:35 AM  Corey Paul  has presented today for surgery, with the diagnosis of PROSTATE CANCER.  The various methods of treatment have been discussed with the patient and family. After consideration of risks, benefits and other options for treatment, the patient has consented to  Procedure(s) with comments: GOLD SEED IMPLANT (N/A) - 30 MINS SPACE OAR INSTILLATION (N/A) as a surgical intervention.  The patient's history has been reviewed, patient examined, no change in status, stable for surgery.  I have reviewed the patient's chart and labs.  Questions were answered to the patient's satisfaction.     Ardis Hughs

## 2022-07-25 ENCOUNTER — Encounter (HOSPITAL_BASED_OUTPATIENT_CLINIC_OR_DEPARTMENT_OTHER): Payer: Self-pay | Admitting: Urology

## 2022-07-26 ENCOUNTER — Telehealth: Payer: Self-pay | Admitting: *Deleted

## 2022-07-26 NOTE — Telephone Encounter (Signed)
CALLED PATIENT TO REMIND OF SIM APPT. FOR 07-28-22- ARRIVAL TIME- 2:45 PM @ [facility], INFORMED PATIENT TO ARRIVE WITH A FULL BLADDER, LVM FOR A RETURN CALL

## 2022-07-27 NOTE — Progress Notes (Signed)
  Radiation Oncology         [phone] ________________________________  Name: Corey Paul MRN: [id number]  Date: 07/28/2022  DOB: 08-07-1965  SIMULATION AND TREATMENT PLANNING NOTE    ICD-10-CM   1. Malignant neoplasm of prostate ([place])  C61       DIAGNOSIS:  57 y.o. gentleman with Stage T1c adenocarcinoma of the prostate with Gleason score of 3+4, and PSA of 5.4.   NARRATIVE:  The patient was brought to the [facility].  Identity was confirmed.  All relevant records and images related to the planned course of therapy were reviewed.  The patient freely provided informed written consent to proceed with treatment after reviewing the details related to the planned course of therapy. The consent form was witnessed and verified by the simulation staff.  Then, the patient was set-up in a stable reproducible supine position for radiation therapy.  A vacuum lock pillow device was custom fabricated to position his legs in a reproducible immobilized position.  Then, I performed a urethrogram under sterile conditions to identify the prostatic apex.  CT images were obtained.  Surface markings were placed.  The CT images were loaded into the planning software.  Then the prostate target and avoidance structures including the rectum, bladder, bowel and hips were contoured.  Treatment planning then occurred.  The radiation prescription was entered and confirmed.  A total of one complex treatment devices was fabricated. I have requested : Intensity Modulated Radiotherapy (IMRT) is medically necessary for this case for the following reason:  Rectal sparing.Marland Kitchen  PLAN:  The patient will receive 70 Gy in 28 fractions.  ________________________________  Sheral Apley Tammi Klippel, M.D.

## 2022-07-28 ENCOUNTER — Ambulatory Visit
Admission: RE | Admit: 2022-07-28 | Discharge: 2022-07-28 | Disposition: A | Discharge status: Home / Self Care | Payer: BC Managed Care – PPO | Source: Ambulatory Visit | Attending: Radiation Oncology | Admitting: Radiation Oncology

## 2022-07-28 DIAGNOSIS — Z191 Hormone sensitive malignancy status: Secondary | ICD-10-CM | POA: Diagnosis not present

## 2022-07-28 DIAGNOSIS — C61 Malignant neoplasm of prostate: Secondary | ICD-10-CM | POA: Insufficient documentation

## 2022-07-28 DIAGNOSIS — Z510 Encounter for antineoplastic radiation therapy: Secondary | ICD-10-CM | POA: Diagnosis not present

## 2022-08-03 DIAGNOSIS — C61 Malignant neoplasm of prostate: Secondary | ICD-10-CM | POA: Insufficient documentation

## 2022-08-03 DIAGNOSIS — Z191 Hormone sensitive malignancy status: Secondary | ICD-10-CM | POA: Diagnosis not present

## 2022-08-15 ENCOUNTER — Ambulatory Visit: Payer: BC Managed Care – PPO

## 2022-08-16 ENCOUNTER — Other Ambulatory Visit: Payer: Self-pay

## 2022-08-16 ENCOUNTER — Ambulatory Visit
Admission: RE | Admit: 2022-08-16 | Discharge: 2022-08-16 | Disposition: A | Discharge status: Home / Self Care | Payer: BC Managed Care – PPO | Source: Ambulatory Visit | Attending: Radiation Oncology | Admitting: Radiation Oncology

## 2022-08-16 DIAGNOSIS — Z191 Hormone sensitive malignancy status: Secondary | ICD-10-CM | POA: Diagnosis not present

## 2022-08-16 DIAGNOSIS — C61 Malignant neoplasm of prostate: Secondary | ICD-10-CM | POA: Diagnosis not present

## 2022-08-16 DIAGNOSIS — Z510 Encounter for antineoplastic radiation therapy: Secondary | ICD-10-CM | POA: Diagnosis not present

## 2022-08-16 LAB — RAD ONC ARIA SESSION SUMMARY
Course Elapsed Days: 0
Plan Fractions Treated to Date: 1
Plan Prescribed Dose Per Fraction: 2.5 Gy
Plan Total Fractions Prescribed: 28
Plan Total Prescribed Dose: 70 Gy
Reference Point Dosage Given to Date: 2.5 Gy
Reference Point Session Dosage Given: 2.5 Gy
Session Number: 1

## 2022-08-17 ENCOUNTER — Other Ambulatory Visit: Payer: Self-pay

## 2022-08-17 ENCOUNTER — Ambulatory Visit
Admission: RE | Admit: 2022-08-17 | Discharge: 2022-08-17 | Disposition: A | Discharge status: Home / Self Care | Payer: BC Managed Care – PPO | Source: Ambulatory Visit | Attending: Radiation Oncology | Admitting: Radiation Oncology

## 2022-08-17 DIAGNOSIS — Z510 Encounter for antineoplastic radiation therapy: Secondary | ICD-10-CM | POA: Diagnosis not present

## 2022-08-17 DIAGNOSIS — Z191 Hormone sensitive malignancy status: Secondary | ICD-10-CM | POA: Diagnosis not present

## 2022-08-17 DIAGNOSIS — C61 Malignant neoplasm of prostate: Secondary | ICD-10-CM | POA: Diagnosis not present

## 2022-08-17 LAB — RAD ONC ARIA SESSION SUMMARY
Course Elapsed Days: 1
Plan Fractions Treated to Date: 2
Plan Prescribed Dose Per Fraction: 2.5 Gy
Plan Total Fractions Prescribed: 28
Plan Total Prescribed Dose: 70 Gy
Reference Point Dosage Given to Date: 5 Gy
Reference Point Session Dosage Given: 2.5 Gy
Session Number: 2

## 2022-08-18 ENCOUNTER — Other Ambulatory Visit: Payer: Self-pay

## 2022-08-18 ENCOUNTER — Ambulatory Visit
Admission: RE | Admit: 2022-08-18 | Discharge: 2022-08-18 | Disposition: A | Discharge status: Home / Self Care | Payer: BC Managed Care – PPO | Source: Ambulatory Visit | Attending: Radiation Oncology | Admitting: Radiation Oncology

## 2022-08-18 DIAGNOSIS — Z510 Encounter for antineoplastic radiation therapy: Secondary | ICD-10-CM | POA: Diagnosis not present

## 2022-08-18 DIAGNOSIS — Z191 Hormone sensitive malignancy status: Secondary | ICD-10-CM | POA: Diagnosis not present

## 2022-08-18 DIAGNOSIS — C61 Malignant neoplasm of prostate: Secondary | ICD-10-CM | POA: Diagnosis not present

## 2022-08-18 LAB — RAD ONC ARIA SESSION SUMMARY
Course Elapsed Days: 2
Plan Fractions Treated to Date: 3
Plan Prescribed Dose Per Fraction: 2.5 Gy
Plan Total Fractions Prescribed: 28
Plan Total Prescribed Dose: 70 Gy
Reference Point Dosage Given to Date: 7.5 Gy
Reference Point Session Dosage Given: 2.5 Gy
Session Number: 3

## 2022-08-19 ENCOUNTER — Ambulatory Visit
Admission: RE | Admit: 2022-08-19 | Discharge: 2022-08-19 | Disposition: A | Discharge status: Home / Self Care | Payer: BC Managed Care – PPO | Source: Ambulatory Visit | Attending: Radiation Oncology | Admitting: Radiation Oncology

## 2022-08-19 ENCOUNTER — Other Ambulatory Visit: Payer: Self-pay

## 2022-08-19 DIAGNOSIS — C61 Malignant neoplasm of prostate: Secondary | ICD-10-CM | POA: Diagnosis not present

## 2022-08-19 DIAGNOSIS — Z510 Encounter for antineoplastic radiation therapy: Secondary | ICD-10-CM | POA: Diagnosis not present

## 2022-08-19 DIAGNOSIS — Z191 Hormone sensitive malignancy status: Secondary | ICD-10-CM | POA: Diagnosis not present

## 2022-08-19 LAB — RAD ONC ARIA SESSION SUMMARY
Course Elapsed Days: 3
Plan Fractions Treated to Date: 4
Plan Prescribed Dose Per Fraction: 2.5 Gy
Plan Total Fractions Prescribed: 28
Plan Total Prescribed Dose: 70 Gy
Reference Point Dosage Given to Date: 10 Gy
Reference Point Session Dosage Given: 2.5 Gy
Session Number: 4

## 2022-08-22 ENCOUNTER — Other Ambulatory Visit: Payer: Self-pay

## 2022-08-22 ENCOUNTER — Ambulatory Visit
Admission: RE | Admit: 2022-08-22 | Discharge: 2022-08-22 | Disposition: A | Discharge status: Home / Self Care | Payer: BC Managed Care – PPO | Source: Ambulatory Visit | Attending: Radiation Oncology | Admitting: Radiation Oncology

## 2022-08-22 DIAGNOSIS — Z191 Hormone sensitive malignancy status: Secondary | ICD-10-CM | POA: Diagnosis not present

## 2022-08-22 DIAGNOSIS — Z510 Encounter for antineoplastic radiation therapy: Secondary | ICD-10-CM | POA: Diagnosis not present

## 2022-08-22 DIAGNOSIS — C61 Malignant neoplasm of prostate: Secondary | ICD-10-CM | POA: Diagnosis not present

## 2022-08-22 LAB — RAD ONC ARIA SESSION SUMMARY
Course Elapsed Days: 6
Plan Fractions Treated to Date: 5
Plan Prescribed Dose Per Fraction: 2.5 Gy
Plan Total Fractions Prescribed: 28
Plan Total Prescribed Dose: 70 Gy
Reference Point Dosage Given to Date: 12.5 Gy
Reference Point Session Dosage Given: 2.5 Gy
Session Number: 5

## 2022-08-23 ENCOUNTER — Other Ambulatory Visit: Payer: Self-pay

## 2022-08-23 ENCOUNTER — Ambulatory Visit
Admission: RE | Admit: 2022-08-23 | Discharge: 2022-08-23 | Disposition: A | Discharge status: Home / Self Care | Payer: BC Managed Care – PPO | Source: Ambulatory Visit | Attending: Radiation Oncology | Admitting: Radiation Oncology

## 2022-08-23 DIAGNOSIS — C61 Malignant neoplasm of prostate: Secondary | ICD-10-CM | POA: Diagnosis not present

## 2022-08-23 DIAGNOSIS — Z191 Hormone sensitive malignancy status: Secondary | ICD-10-CM | POA: Diagnosis not present

## 2022-08-23 DIAGNOSIS — Z510 Encounter for antineoplastic radiation therapy: Secondary | ICD-10-CM | POA: Diagnosis not present

## 2022-08-23 LAB — RAD ONC ARIA SESSION SUMMARY
Course Elapsed Days: 7
Plan Fractions Treated to Date: 6
Plan Prescribed Dose Per Fraction: 2.5 Gy
Plan Total Fractions Prescribed: 28
Plan Total Prescribed Dose: 70 Gy
Reference Point Dosage Given to Date: 15 Gy
Reference Point Session Dosage Given: 2.5 Gy
Session Number: 6

## 2022-08-24 ENCOUNTER — Ambulatory Visit
Admission: RE | Admit: 2022-08-24 | Discharge: 2022-08-24 | Disposition: A | Discharge status: Home / Self Care | Payer: BC Managed Care – PPO | Source: Ambulatory Visit | Attending: Radiation Oncology | Admitting: Radiation Oncology

## 2022-08-24 ENCOUNTER — Other Ambulatory Visit: Payer: Self-pay

## 2022-08-24 DIAGNOSIS — Z191 Hormone sensitive malignancy status: Secondary | ICD-10-CM | POA: Diagnosis not present

## 2022-08-24 DIAGNOSIS — C61 Malignant neoplasm of prostate: Secondary | ICD-10-CM | POA: Diagnosis not present

## 2022-08-24 DIAGNOSIS — Z510 Encounter for antineoplastic radiation therapy: Secondary | ICD-10-CM | POA: Diagnosis not present

## 2022-08-24 LAB — RAD ONC ARIA SESSION SUMMARY
Course Elapsed Days: 8
Plan Fractions Treated to Date: 7
Plan Prescribed Dose Per Fraction: 2.5 Gy
Plan Total Fractions Prescribed: 28
Plan Total Prescribed Dose: 70 Gy
Reference Point Dosage Given to Date: 17.5 Gy
Reference Point Session Dosage Given: 2.5 Gy
Session Number: 7

## 2022-08-25 ENCOUNTER — Other Ambulatory Visit: Payer: Self-pay

## 2022-08-25 ENCOUNTER — Ambulatory Visit
Admission: RE | Admit: 2022-08-25 | Discharge: 2022-08-25 | Disposition: A | Discharge status: Home / Self Care | Payer: BC Managed Care – PPO | Source: Ambulatory Visit | Attending: Radiation Oncology | Admitting: Radiation Oncology

## 2022-08-25 DIAGNOSIS — Z510 Encounter for antineoplastic radiation therapy: Secondary | ICD-10-CM | POA: Diagnosis not present

## 2022-08-25 DIAGNOSIS — Z191 Hormone sensitive malignancy status: Secondary | ICD-10-CM | POA: Diagnosis not present

## 2022-08-25 DIAGNOSIS — C61 Malignant neoplasm of prostate: Secondary | ICD-10-CM | POA: Diagnosis not present

## 2022-08-25 LAB — RAD ONC ARIA SESSION SUMMARY
Course Elapsed Days: 9
Plan Fractions Treated to Date: 8
Plan Prescribed Dose Per Fraction: 2.5 Gy
Plan Total Fractions Prescribed: 28
Plan Total Prescribed Dose: 70 Gy
Reference Point Dosage Given to Date: 20 Gy
Reference Point Session Dosage Given: 2.5 Gy
Session Number: 8

## 2022-08-26 ENCOUNTER — Ambulatory Visit
Admission: RE | Admit: 2022-08-26 | Discharge: 2022-08-26 | Disposition: A | Discharge status: Home / Self Care | Payer: BC Managed Care – PPO | Source: Ambulatory Visit | Attending: Radiation Oncology | Admitting: Radiation Oncology

## 2022-08-26 ENCOUNTER — Other Ambulatory Visit: Payer: Self-pay

## 2022-08-26 DIAGNOSIS — C61 Malignant neoplasm of prostate: Secondary | ICD-10-CM | POA: Diagnosis not present

## 2022-08-26 DIAGNOSIS — Z191 Hormone sensitive malignancy status: Secondary | ICD-10-CM | POA: Diagnosis not present

## 2022-08-26 DIAGNOSIS — Z510 Encounter for antineoplastic radiation therapy: Secondary | ICD-10-CM | POA: Diagnosis not present

## 2022-08-26 LAB — RAD ONC ARIA SESSION SUMMARY
Course Elapsed Days: 10
Plan Fractions Treated to Date: 9
Plan Prescribed Dose Per Fraction: 2.5 Gy
Plan Total Fractions Prescribed: 28
Plan Total Prescribed Dose: 70 Gy
Reference Point Dosage Given to Date: 22.5 Gy
Reference Point Session Dosage Given: 2.5 Gy
Session Number: 9

## 2022-08-29 ENCOUNTER — Ambulatory Visit
Admission: RE | Admit: 2022-08-29 | Discharge: 2022-08-29 | Disposition: A | Discharge status: Home / Self Care | Payer: BC Managed Care – PPO | Source: Ambulatory Visit | Attending: Radiation Oncology | Admitting: Radiation Oncology

## 2022-08-29 ENCOUNTER — Other Ambulatory Visit: Payer: Self-pay

## 2022-08-29 DIAGNOSIS — Z510 Encounter for antineoplastic radiation therapy: Secondary | ICD-10-CM | POA: Diagnosis not present

## 2022-08-29 DIAGNOSIS — C61 Malignant neoplasm of prostate: Secondary | ICD-10-CM | POA: Diagnosis not present

## 2022-08-29 DIAGNOSIS — Z191 Hormone sensitive malignancy status: Secondary | ICD-10-CM | POA: Diagnosis not present

## 2022-08-29 LAB — RAD ONC ARIA SESSION SUMMARY
Course Elapsed Days: 13
Plan Fractions Treated to Date: 10
Plan Prescribed Dose Per Fraction: 2.5 Gy
Plan Total Fractions Prescribed: 28
Plan Total Prescribed Dose: 70 Gy
Reference Point Dosage Given to Date: 25 Gy
Reference Point Session Dosage Given: 2.5 Gy
Session Number: 10

## 2022-08-30 ENCOUNTER — Ambulatory Visit
Admission: RE | Admit: 2022-08-30 | Discharge: 2022-08-30 | Disposition: A | Discharge status: Home / Self Care | Payer: BC Managed Care – PPO | Source: Ambulatory Visit | Attending: Radiation Oncology | Admitting: Radiation Oncology

## 2022-08-30 ENCOUNTER — Other Ambulatory Visit: Payer: Self-pay

## 2022-08-30 DIAGNOSIS — Z510 Encounter for antineoplastic radiation therapy: Secondary | ICD-10-CM | POA: Diagnosis not present

## 2022-08-30 DIAGNOSIS — Z191 Hormone sensitive malignancy status: Secondary | ICD-10-CM | POA: Diagnosis not present

## 2022-08-30 DIAGNOSIS — C61 Malignant neoplasm of prostate: Secondary | ICD-10-CM | POA: Diagnosis not present

## 2022-08-30 LAB — RAD ONC ARIA SESSION SUMMARY
Course Elapsed Days: 14
Plan Fractions Treated to Date: 11
Plan Prescribed Dose Per Fraction: 2.5 Gy
Plan Total Fractions Prescribed: 28
Plan Total Prescribed Dose: 70 Gy
Reference Point Dosage Given to Date: 27.5 Gy
Reference Point Session Dosage Given: 2.5 Gy
Session Number: 11

## 2022-08-31 ENCOUNTER — Ambulatory Visit
Admission: RE | Admit: 2022-08-31 | Discharge: 2022-08-31 | Disposition: A | Discharge status: Home / Self Care | Payer: BC Managed Care – PPO | Source: Ambulatory Visit | Attending: Radiation Oncology | Admitting: Radiation Oncology

## 2022-08-31 ENCOUNTER — Other Ambulatory Visit: Payer: Self-pay

## 2022-08-31 DIAGNOSIS — Z510 Encounter for antineoplastic radiation therapy: Secondary | ICD-10-CM | POA: Diagnosis not present

## 2022-08-31 DIAGNOSIS — C61 Malignant neoplasm of prostate: Secondary | ICD-10-CM | POA: Diagnosis not present

## 2022-08-31 DIAGNOSIS — Z191 Hormone sensitive malignancy status: Secondary | ICD-10-CM | POA: Diagnosis not present

## 2022-08-31 LAB — RAD ONC ARIA SESSION SUMMARY
Course Elapsed Days: 15
Plan Fractions Treated to Date: 12
Plan Prescribed Dose Per Fraction: 2.5 Gy
Plan Total Fractions Prescribed: 28
Plan Total Prescribed Dose: 70 Gy
Reference Point Dosage Given to Date: 30 Gy
Reference Point Session Dosage Given: 2.5 Gy
Session Number: 12

## 2022-09-01 ENCOUNTER — Ambulatory Visit
Admission: RE | Admit: 2022-09-01 | Discharge: 2022-09-01 | Disposition: A | Discharge status: Home / Self Care | Payer: BC Managed Care – PPO | Source: Ambulatory Visit | Attending: Radiation Oncology | Admitting: Radiation Oncology

## 2022-09-01 ENCOUNTER — Other Ambulatory Visit: Payer: Self-pay

## 2022-09-01 DIAGNOSIS — Z191 Hormone sensitive malignancy status: Secondary | ICD-10-CM | POA: Diagnosis not present

## 2022-09-01 DIAGNOSIS — C61 Malignant neoplasm of prostate: Secondary | ICD-10-CM | POA: Diagnosis not present

## 2022-09-01 DIAGNOSIS — Z510 Encounter for antineoplastic radiation therapy: Secondary | ICD-10-CM | POA: Diagnosis not present

## 2022-09-01 LAB — RAD ONC ARIA SESSION SUMMARY
Course Elapsed Days: 16
Plan Fractions Treated to Date: 13
Plan Prescribed Dose Per Fraction: 2.5 Gy
Plan Total Fractions Prescribed: 28
Plan Total Prescribed Dose: 70 Gy
Reference Point Dosage Given to Date: 32.5 Gy
Reference Point Session Dosage Given: 2.5 Gy
Session Number: 13

## 2022-09-02 ENCOUNTER — Other Ambulatory Visit: Payer: Self-pay

## 2022-09-02 ENCOUNTER — Ambulatory Visit
Admission: RE | Admit: 2022-09-02 | Discharge: 2022-09-02 | Disposition: A | Discharge status: Home / Self Care | Payer: BC Managed Care – PPO | Source: Ambulatory Visit | Attending: Radiation Oncology | Admitting: Radiation Oncology

## 2022-09-02 DIAGNOSIS — Z191 Hormone sensitive malignancy status: Secondary | ICD-10-CM | POA: Diagnosis not present

## 2022-09-02 DIAGNOSIS — C61 Malignant neoplasm of prostate: Secondary | ICD-10-CM | POA: Diagnosis not present

## 2022-09-02 DIAGNOSIS — Z510 Encounter for antineoplastic radiation therapy: Secondary | ICD-10-CM | POA: Diagnosis not present

## 2022-09-02 LAB — RAD ONC ARIA SESSION SUMMARY
Course Elapsed Days: 17
Plan Fractions Treated to Date: 14
Plan Prescribed Dose Per Fraction: 2.5 Gy
Plan Total Fractions Prescribed: 28
Plan Total Prescribed Dose: 70 Gy
Reference Point Dosage Given to Date: 35 Gy
Reference Point Session Dosage Given: 2.5 Gy
Session Number: 14

## 2022-09-05 ENCOUNTER — Other Ambulatory Visit: Payer: Self-pay

## 2022-09-05 ENCOUNTER — Ambulatory Visit
Admission: RE | Admit: 2022-09-05 | Discharge: 2022-09-05 | Disposition: A | Discharge status: Home / Self Care | Payer: BC Managed Care – PPO | Source: Ambulatory Visit | Attending: Radiation Oncology | Admitting: Radiation Oncology

## 2022-09-05 DIAGNOSIS — C61 Malignant neoplasm of prostate: Secondary | ICD-10-CM | POA: Diagnosis not present

## 2022-09-05 DIAGNOSIS — Z510 Encounter for antineoplastic radiation therapy: Secondary | ICD-10-CM | POA: Diagnosis not present

## 2022-09-05 DIAGNOSIS — Z191 Hormone sensitive malignancy status: Secondary | ICD-10-CM | POA: Diagnosis not present

## 2022-09-05 LAB — RAD ONC ARIA SESSION SUMMARY
Course Elapsed Days: 20
Plan Fractions Treated to Date: 15
Plan Prescribed Dose Per Fraction: 2.5 Gy
Plan Total Fractions Prescribed: 28
Plan Total Prescribed Dose: 70 Gy
Reference Point Dosage Given to Date: 37.5 Gy
Reference Point Session Dosage Given: 2.5 Gy
Session Number: 15

## 2022-09-06 ENCOUNTER — Other Ambulatory Visit: Payer: Self-pay

## 2022-09-06 ENCOUNTER — Ambulatory Visit
Admission: RE | Admit: 2022-09-06 | Discharge: 2022-09-06 | Disposition: A | Discharge status: Home / Self Care | Payer: BC Managed Care – PPO | Source: Ambulatory Visit | Attending: Radiation Oncology | Admitting: Radiation Oncology

## 2022-09-06 DIAGNOSIS — C61 Malignant neoplasm of prostate: Secondary | ICD-10-CM | POA: Diagnosis not present

## 2022-09-06 DIAGNOSIS — Z510 Encounter for antineoplastic radiation therapy: Secondary | ICD-10-CM | POA: Diagnosis not present

## 2022-09-06 DIAGNOSIS — Z191 Hormone sensitive malignancy status: Secondary | ICD-10-CM | POA: Diagnosis not present

## 2022-09-06 LAB — RAD ONC ARIA SESSION SUMMARY
Course Elapsed Days: 21
Plan Fractions Treated to Date: 16
Plan Prescribed Dose Per Fraction: 2.5 Gy
Plan Total Fractions Prescribed: 28
Plan Total Prescribed Dose: 70 Gy
Reference Point Dosage Given to Date: 40 Gy
Reference Point Session Dosage Given: 2.5 Gy
Session Number: 16

## 2022-09-07 ENCOUNTER — Ambulatory Visit: Payer: BC Managed Care – PPO

## 2022-09-07 ENCOUNTER — Other Ambulatory Visit: Payer: Self-pay

## 2022-09-07 ENCOUNTER — Ambulatory Visit
Admission: RE | Admit: 2022-09-07 | Discharge: 2022-09-07 | Disposition: A | Discharge status: Home / Self Care | Payer: BC Managed Care – PPO | Source: Ambulatory Visit | Attending: Radiation Oncology | Admitting: Radiation Oncology

## 2022-09-07 DIAGNOSIS — Z510 Encounter for antineoplastic radiation therapy: Secondary | ICD-10-CM | POA: Diagnosis not present

## 2022-09-07 DIAGNOSIS — Z191 Hormone sensitive malignancy status: Secondary | ICD-10-CM | POA: Diagnosis not present

## 2022-09-07 DIAGNOSIS — C61 Malignant neoplasm of prostate: Secondary | ICD-10-CM | POA: Diagnosis not present

## 2022-09-07 LAB — RAD ONC ARIA SESSION SUMMARY
Course Elapsed Days: 22
Plan Fractions Treated to Date: 17
Plan Prescribed Dose Per Fraction: 2.5 Gy
Plan Total Fractions Prescribed: 28
Plan Total Prescribed Dose: 70 Gy
Reference Point Dosage Given to Date: 42.5 Gy
Reference Point Session Dosage Given: 2.5 Gy
Session Number: 17

## 2022-09-08 ENCOUNTER — Ambulatory Visit
Admission: RE | Admit: 2022-09-08 | Discharge: 2022-09-08 | Disposition: A | Discharge status: Home / Self Care | Payer: BC Managed Care – PPO | Source: Ambulatory Visit | Attending: Radiation Oncology | Admitting: Radiation Oncology

## 2022-09-08 ENCOUNTER — Other Ambulatory Visit: Payer: Self-pay

## 2022-09-08 DIAGNOSIS — Z510 Encounter for antineoplastic radiation therapy: Secondary | ICD-10-CM | POA: Diagnosis not present

## 2022-09-08 DIAGNOSIS — C61 Malignant neoplasm of prostate: Secondary | ICD-10-CM | POA: Diagnosis not present

## 2022-09-08 DIAGNOSIS — Z191 Hormone sensitive malignancy status: Secondary | ICD-10-CM | POA: Diagnosis not present

## 2022-09-08 LAB — RAD ONC ARIA SESSION SUMMARY
Course Elapsed Days: 23
Plan Fractions Treated to Date: 18
Plan Prescribed Dose Per Fraction: 2.5 Gy
Plan Total Fractions Prescribed: 28
Plan Total Prescribed Dose: 70 Gy
Reference Point Dosage Given to Date: 45 Gy
Reference Point Session Dosage Given: 2.5 Gy
Session Number: 18

## 2022-09-09 ENCOUNTER — Ambulatory Visit: Payer: BC Managed Care – PPO

## 2022-09-12 ENCOUNTER — Ambulatory Visit
Admission: RE | Admit: 2022-09-12 | Discharge: 2022-09-12 | Disposition: A | Discharge status: Home / Self Care | Payer: BC Managed Care – PPO | Source: Ambulatory Visit | Attending: Radiation Oncology | Admitting: Radiation Oncology

## 2022-09-12 ENCOUNTER — Other Ambulatory Visit: Payer: Self-pay

## 2022-09-12 DIAGNOSIS — Z510 Encounter for antineoplastic radiation therapy: Secondary | ICD-10-CM | POA: Diagnosis not present

## 2022-09-12 DIAGNOSIS — C61 Malignant neoplasm of prostate: Secondary | ICD-10-CM | POA: Diagnosis not present

## 2022-09-12 DIAGNOSIS — Z191 Hormone sensitive malignancy status: Secondary | ICD-10-CM | POA: Diagnosis not present

## 2022-09-12 LAB — RAD ONC ARIA SESSION SUMMARY
Course Elapsed Days: 27
Plan Fractions Treated to Date: 19
Plan Prescribed Dose Per Fraction: 2.5 Gy
Plan Total Fractions Prescribed: 28
Plan Total Prescribed Dose: 70 Gy
Reference Point Dosage Given to Date: 47.5 Gy
Reference Point Session Dosage Given: 2.5 Gy
Session Number: 19

## 2022-09-13 ENCOUNTER — Ambulatory Visit
Admission: RE | Admit: 2022-09-13 | Discharge: 2022-09-13 | Disposition: A | Discharge status: Home / Self Care | Payer: BC Managed Care – PPO | Source: Ambulatory Visit | Attending: Radiation Oncology | Admitting: Radiation Oncology

## 2022-09-13 ENCOUNTER — Other Ambulatory Visit: Payer: Self-pay

## 2022-09-13 DIAGNOSIS — Z510 Encounter for antineoplastic radiation therapy: Secondary | ICD-10-CM | POA: Diagnosis not present

## 2022-09-13 DIAGNOSIS — Z191 Hormone sensitive malignancy status: Secondary | ICD-10-CM | POA: Diagnosis not present

## 2022-09-13 DIAGNOSIS — C61 Malignant neoplasm of prostate: Secondary | ICD-10-CM | POA: Diagnosis not present

## 2022-09-13 LAB — RAD ONC ARIA SESSION SUMMARY
Course Elapsed Days: 28
Plan Fractions Treated to Date: 20
Plan Prescribed Dose Per Fraction: 2.5 Gy
Plan Total Fractions Prescribed: 28
Plan Total Prescribed Dose: 70 Gy
Reference Point Dosage Given to Date: 50 Gy
Reference Point Session Dosage Given: 2.5 Gy
Session Number: 20

## 2022-09-14 ENCOUNTER — Ambulatory Visit
Admission: RE | Admit: 2022-09-14 | Discharge: 2022-09-14 | Disposition: A | Discharge status: Home / Self Care | Payer: BC Managed Care – PPO | Source: Ambulatory Visit | Attending: Radiation Oncology | Admitting: Radiation Oncology

## 2022-09-14 ENCOUNTER — Other Ambulatory Visit: Payer: Self-pay

## 2022-09-14 DIAGNOSIS — Z510 Encounter for antineoplastic radiation therapy: Secondary | ICD-10-CM | POA: Diagnosis not present

## 2022-09-14 DIAGNOSIS — Z191 Hormone sensitive malignancy status: Secondary | ICD-10-CM | POA: Diagnosis not present

## 2022-09-14 DIAGNOSIS — C61 Malignant neoplasm of prostate: Secondary | ICD-10-CM | POA: Diagnosis not present

## 2022-09-14 LAB — RAD ONC ARIA SESSION SUMMARY
Course Elapsed Days: 29
Plan Fractions Treated to Date: 21
Plan Prescribed Dose Per Fraction: 2.5 Gy
Plan Total Fractions Prescribed: 28
Plan Total Prescribed Dose: 70 Gy
Reference Point Dosage Given to Date: 52.5 Gy
Reference Point Session Dosage Given: 2.5 Gy
Session Number: 21

## 2022-09-15 ENCOUNTER — Other Ambulatory Visit: Payer: Self-pay

## 2022-09-15 ENCOUNTER — Ambulatory Visit
Admission: RE | Admit: 2022-09-15 | Discharge: 2022-09-15 | Disposition: A | Discharge status: Home / Self Care | Payer: BC Managed Care – PPO | Source: Ambulatory Visit | Attending: Radiation Oncology | Admitting: Radiation Oncology

## 2022-09-15 DIAGNOSIS — Z191 Hormone sensitive malignancy status: Secondary | ICD-10-CM | POA: Diagnosis not present

## 2022-09-15 DIAGNOSIS — Z510 Encounter for antineoplastic radiation therapy: Secondary | ICD-10-CM | POA: Diagnosis not present

## 2022-09-15 DIAGNOSIS — C61 Malignant neoplasm of prostate: Secondary | ICD-10-CM | POA: Diagnosis not present

## 2022-09-15 LAB — RAD ONC ARIA SESSION SUMMARY
Course Elapsed Days: 30
Plan Fractions Treated to Date: 22
Plan Prescribed Dose Per Fraction: 2.5 Gy
Plan Total Fractions Prescribed: 28
Plan Total Prescribed Dose: 70 Gy
Reference Point Dosage Given to Date: 55 Gy
Reference Point Session Dosage Given: 2.5 Gy
Session Number: 22

## 2022-09-16 ENCOUNTER — Other Ambulatory Visit: Payer: Self-pay

## 2022-09-16 ENCOUNTER — Ambulatory Visit
Admission: RE | Admit: 2022-09-16 | Discharge: 2022-09-16 | Disposition: A | Discharge status: Home / Self Care | Payer: BC Managed Care – PPO | Source: Ambulatory Visit | Attending: Radiation Oncology | Admitting: Radiation Oncology

## 2022-09-16 DIAGNOSIS — Z191 Hormone sensitive malignancy status: Secondary | ICD-10-CM | POA: Diagnosis not present

## 2022-09-16 DIAGNOSIS — Z510 Encounter for antineoplastic radiation therapy: Secondary | ICD-10-CM | POA: Diagnosis not present

## 2022-09-16 DIAGNOSIS — C61 Malignant neoplasm of prostate: Secondary | ICD-10-CM | POA: Diagnosis not present

## 2022-09-16 LAB — RAD ONC ARIA SESSION SUMMARY
Course Elapsed Days: 31
Plan Fractions Treated to Date: 23
Plan Prescribed Dose Per Fraction: 2.5 Gy
Plan Total Fractions Prescribed: 28
Plan Total Prescribed Dose: 70 Gy
Reference Point Dosage Given to Date: 57.5 Gy
Reference Point Session Dosage Given: 2.5 Gy
Session Number: 23

## 2022-09-19 ENCOUNTER — Other Ambulatory Visit: Payer: Self-pay

## 2022-09-19 ENCOUNTER — Ambulatory Visit
Admission: RE | Admit: 2022-09-19 | Discharge: 2022-09-19 | Disposition: A | Discharge status: Home / Self Care | Payer: BC Managed Care – PPO | Source: Ambulatory Visit | Attending: Radiation Oncology | Admitting: Radiation Oncology

## 2022-09-19 DIAGNOSIS — C61 Malignant neoplasm of prostate: Secondary | ICD-10-CM | POA: Diagnosis not present

## 2022-09-19 DIAGNOSIS — Z191 Hormone sensitive malignancy status: Secondary | ICD-10-CM | POA: Diagnosis not present

## 2022-09-19 DIAGNOSIS — Z510 Encounter for antineoplastic radiation therapy: Secondary | ICD-10-CM | POA: Diagnosis not present

## 2022-09-19 LAB — RAD ONC ARIA SESSION SUMMARY
Course Elapsed Days: 34
Plan Fractions Treated to Date: 24
Plan Prescribed Dose Per Fraction: 2.5 Gy
Plan Total Fractions Prescribed: 28
Plan Total Prescribed Dose: 70 Gy
Reference Point Dosage Given to Date: 60 Gy
Reference Point Session Dosage Given: 2.5 Gy
Session Number: 24

## 2022-09-20 ENCOUNTER — Other Ambulatory Visit: Payer: Self-pay

## 2022-09-20 ENCOUNTER — Ambulatory Visit
Admission: RE | Admit: 2022-09-20 | Discharge: 2022-09-20 | Disposition: A | Discharge status: Home / Self Care | Payer: BC Managed Care – PPO | Source: Ambulatory Visit | Attending: Radiation Oncology | Admitting: Radiation Oncology

## 2022-09-20 DIAGNOSIS — Z191 Hormone sensitive malignancy status: Secondary | ICD-10-CM | POA: Diagnosis not present

## 2022-09-20 DIAGNOSIS — Z510 Encounter for antineoplastic radiation therapy: Secondary | ICD-10-CM | POA: Diagnosis not present

## 2022-09-20 DIAGNOSIS — C61 Malignant neoplasm of prostate: Secondary | ICD-10-CM | POA: Diagnosis not present

## 2022-09-20 LAB — RAD ONC ARIA SESSION SUMMARY
Course Elapsed Days: 35
Plan Fractions Treated to Date: 25
Plan Prescribed Dose Per Fraction: 2.5 Gy
Plan Total Fractions Prescribed: 28
Plan Total Prescribed Dose: 70 Gy
Reference Point Dosage Given to Date: 62.5 Gy
Reference Point Session Dosage Given: 2.5 Gy
Session Number: 25

## 2022-09-21 ENCOUNTER — Other Ambulatory Visit: Payer: Self-pay

## 2022-09-21 ENCOUNTER — Ambulatory Visit: Payer: BC Managed Care – PPO

## 2022-09-21 ENCOUNTER — Ambulatory Visit
Admission: RE | Admit: 2022-09-21 | Discharge: 2022-09-21 | Disposition: A | Discharge status: Home / Self Care | Payer: BC Managed Care – PPO | Source: Ambulatory Visit | Attending: Radiation Oncology | Admitting: Radiation Oncology

## 2022-09-21 DIAGNOSIS — Z191 Hormone sensitive malignancy status: Secondary | ICD-10-CM | POA: Diagnosis not present

## 2022-09-21 DIAGNOSIS — C61 Malignant neoplasm of prostate: Secondary | ICD-10-CM | POA: Diagnosis not present

## 2022-09-21 DIAGNOSIS — Z510 Encounter for antineoplastic radiation therapy: Secondary | ICD-10-CM | POA: Diagnosis not present

## 2022-09-21 LAB — RAD ONC ARIA SESSION SUMMARY
Course Elapsed Days: 36
Plan Fractions Treated to Date: 26
Plan Prescribed Dose Per Fraction: 2.5 Gy
Plan Total Fractions Prescribed: 28
Plan Total Prescribed Dose: 70 Gy
Reference Point Dosage Given to Date: 65 Gy
Reference Point Session Dosage Given: 2.5 Gy
Session Number: 26

## 2022-09-22 ENCOUNTER — Ambulatory Visit: Payer: BC Managed Care – PPO

## 2022-09-22 ENCOUNTER — Ambulatory Visit
Admission: RE | Admit: 2022-09-22 | Discharge: 2022-09-22 | Disposition: A | Discharge status: Home / Self Care | Payer: BC Managed Care – PPO | Source: Ambulatory Visit | Attending: Radiation Oncology | Admitting: Radiation Oncology

## 2022-09-22 ENCOUNTER — Other Ambulatory Visit: Payer: Self-pay

## 2022-09-22 DIAGNOSIS — Z191 Hormone sensitive malignancy status: Secondary | ICD-10-CM | POA: Diagnosis not present

## 2022-09-22 DIAGNOSIS — C61 Malignant neoplasm of prostate: Secondary | ICD-10-CM | POA: Diagnosis not present

## 2022-09-22 DIAGNOSIS — Z510 Encounter for antineoplastic radiation therapy: Secondary | ICD-10-CM | POA: Diagnosis not present

## 2022-09-22 LAB — RAD ONC ARIA SESSION SUMMARY
Course Elapsed Days: 37
Plan Fractions Treated to Date: 27
Plan Prescribed Dose Per Fraction: 2.5 Gy
Plan Total Fractions Prescribed: 28
Plan Total Prescribed Dose: 70 Gy
Reference Point Dosage Given to Date: 67.5 Gy
Reference Point Session Dosage Given: 2.5 Gy
Session Number: 27

## 2022-09-23 ENCOUNTER — Other Ambulatory Visit: Payer: Self-pay

## 2022-09-23 ENCOUNTER — Ambulatory Visit
Admission: RE | Admit: 2022-09-23 | Discharge: 2022-09-23 | Disposition: A | Discharge status: Home / Self Care | Payer: BC Managed Care – PPO | Source: Ambulatory Visit | Attending: Radiation Oncology | Admitting: Radiation Oncology

## 2022-09-23 DIAGNOSIS — Z510 Encounter for antineoplastic radiation therapy: Secondary | ICD-10-CM | POA: Diagnosis not present

## 2022-09-23 DIAGNOSIS — Z191 Hormone sensitive malignancy status: Secondary | ICD-10-CM | POA: Diagnosis not present

## 2022-09-23 DIAGNOSIS — C61 Malignant neoplasm of prostate: Secondary | ICD-10-CM | POA: Diagnosis not present

## 2022-09-23 LAB — RAD ONC ARIA SESSION SUMMARY
Course Elapsed Days: 38
Plan Fractions Treated to Date: 28
Plan Prescribed Dose Per Fraction: 2.5 Gy
Plan Total Fractions Prescribed: 28
Plan Total Prescribed Dose: 70 Gy
Reference Point Dosage Given to Date: 70 Gy
Reference Point Session Dosage Given: 2.5 Gy
Session Number: 28

## 2022-09-29 NOTE — Progress Notes (Signed)
RN left voicemail for call back for post treatment review.

## 2022-10-04 NOTE — Radiation Completion Notes (Addendum)
  Radiation Oncology         [phone] ________________________________  Name: MARKAVION CHRISLER MRN: [id number]  Date: 09/23/2022  DOB: 25-May-1965  End of Treatment Note  Patient Name: Corey Paul, Corey Paul MRN: [id number] Date of Birth: March 14, 1966 Referring Physician: Karoline Caldwell, M.D. Date of Service: 2022-10-04 Radiation Oncologist: Margaretmary Bayley, M.D. [facility] [place]     RADIATION ONCOLOGY END OF TREATMENT   Diagnosis: 57 y.o. gentleman with Stage T1c adenocarcinoma of the prostate with Gleason score of 3+4, and PSA of 5.4   Intent: Curative     ==========DELIVERED PLANS==========  First Treatment Date: 2022-08-16 - Last Treatment Date: 2022-09-23   Plan Name: Prostate Site: Prostate Technique: IMRT Mode: Photon Dose Per Fraction: 2.5 Gy Prescribed Dose (Delivered / Prescribed): 70 Gy / 70 Gy Prescribed Fxs (Delivered / Prescribed): 28 / 28     ==========ON TREATMENT VISIT DATES========== 2022-08-19, 2022-08-26, 2022-09-02, 2022-09-08, 2022-09-16, 2022-09-22    See weekly On Treatment Notes in Epic for details. The patient tolerated radiation treatment relatively well.   He did experience increased nocturia 3-4 times per night.  The patient will receive a call in about one month from the radiation oncology department. He will continue follow up with his urologist, Dr. Benancio Deeds as well.  ------------------------------------------------   Margaretmary Dys, MD [facility]  Radiation Oncology Direct Dial: [phone]  Fax: [phone] [email or web address]  Skype  LinkedIn

## 2022-10-06 NOTE — Progress Notes (Signed)
RN spoke with [facility] to scheduled patient for post treatment follow up.  Patient is now scheduled for 8/29 @ 8:15 am with Dr. Berneice Heinrich.   RN left message for call back.

## 2022-10-07 NOTE — Progress Notes (Signed)
Patient was a RadOnc Consult on 06/07/22 for his stage T1c adenocarcinoma of the prostate with Gleason score of 3+4, and PSA of 5.4.  Patient proceed with treatment recommendations of 5.5 weeks of external radiation and had his final radiation treatment on 09/23/22.   Patient is scheduled for a post treatment nurse call on 11/01/22 and has his first post treatment appointment on 12/29/22 at [facility] with urologist Dr. Berneice Heinrich.     RN provided education on importance of post treatment follow up's with urology.  Patient verbalized understanding and agreement.

## 2022-10-21 ENCOUNTER — Other Ambulatory Visit: Payer: Self-pay | Admitting: Urology

## 2022-10-21 DIAGNOSIS — C61 Malignant neoplasm of prostate: Secondary | ICD-10-CM

## 2022-10-31 NOTE — Progress Notes (Signed)
RN returned call about upcoming appointment.  RN left detailed message, direct number left for call back if needed.

## 2022-11-01 ENCOUNTER — Ambulatory Visit
Admission: RE | Admit: 2022-11-01 | Discharge: 2022-11-01 | Disposition: A | Discharge status: Home / Self Care | Payer: BC Managed Care – PPO | Source: Ambulatory Visit | Attending: Radiation Oncology | Admitting: Radiation Oncology

## 2022-11-01 NOTE — Progress Notes (Signed)
  Radiation Oncology         [phone] ________________________________  Name: Corey Paul MRN: [id number]  Date of Service: 11/01/2022  DOB: 08/04/1965  Post Treatment Telephone Note  Diagnosis:   57 y.o. gentleman with Stage T1c adenocarcinoma of the prostate with Gleason score of 3+4, and PSA of 5.4 (as documented in provider EOT note)   Pre Treatment IPSS Score: 8 (as documented in the provider consult note)   The patient was available for call today.   Symptoms of fatigue have improved since completing therapy.  Symptoms of bladder changes have improved since completing therapy. Current symptoms include none, and medications for bladder symptoms include none.  Symptoms of bowel changes have improved since completing therapy. Current symptoms include none, and medications for bowel symptoms include none.     Post Treatment IPSS Score: IPSS Questionnaire (AUA-7): Over the past month.   1)  How often have you had a sensation of not emptying your bladder completely after you finish urinating?  0 - Not at all  2)  How often have you had to urinate again less than two hours after you finished urinating? 0 - Not at all  3)  How often have you found you stopped and started again several times when you urinated?  0 - Not at all  4) How difficult have you found it to postpone urination?  0 - Not at all  5) How often have you had a weak urinary stream?  0 - Not at all  6) How often have you had to push or strain to begin urination?  0 - Not at all  7) How many times did you most typically get up to urinate from the time you went to bed until the time you got up in the morning?  2 - 2 times  Total score:  2. Which indicates mild symptoms  0-7 mildly symptomatic   8-19 moderately symptomatic   20-35 severely symptomatic    Patient has a scheduled follow up visit with his urologist, Dr. Berneice Heinrich, on 12/2022 for ongoing surveillance. He was counseled that PSA levels will be drawn in  the urology office, and was reassured that additional time is expected to improve bowel and bladder symptoms. He was encouraged to call back with concerns or questions regarding radiation.  This concludes the interaction.  Ruel Favors, LPN

## 2022-11-14 ENCOUNTER — Encounter: Payer: Self-pay | Admitting: *Deleted

## 2022-12-05 ENCOUNTER — Inpatient Hospital Stay: Payer: BC Managed Care – PPO | Admitting: *Deleted

## 2022-12-05 ENCOUNTER — Telehealth: Payer: Self-pay | Admitting: *Deleted

## 2022-12-06 ENCOUNTER — Encounter: Payer: BC Managed Care – PPO | Admitting: *Deleted

## 2022-12-07 ENCOUNTER — Inpatient Hospital Stay: Payer: BC Managed Care – PPO | Attending: Adult Health | Admitting: *Deleted

## 2022-12-07 DIAGNOSIS — C61 Malignant neoplasm of prostate: Secondary | ICD-10-CM

## 2022-12-08 ENCOUNTER — Encounter: Payer: Self-pay | Admitting: *Deleted

## 2022-12-08 NOTE — Progress Notes (Signed)
SCP reviewed and completed. 

## 2022-12-08 NOTE — Progress Notes (Signed)
Pt has been informed of upcoming scheduled appts with [facility] , PSA labs on Aug. 9 at 9:00a and Aug 29 at 8:15a with Dr. Berneice Heinrich. Pt confirmed appts.

## 2022-12-08 NOTE — Progress Notes (Signed)
Called [facility] to inform staff that pt unable to make PSA appt on tomorrow Aug. 9th and to see if they can reschedule him for week later. [facility] answering service took the message and will get message to staff.

## 2022-12-09 ENCOUNTER — Encounter: Payer: Self-pay | Admitting: *Deleted

## 2022-12-09 NOTE — Progress Notes (Signed)
Pt has been informed that PSA lab appt has been rescheduled for him to August 19 at 9:00a at [facility].

## 2022-12-19 DIAGNOSIS — C61 Malignant neoplasm of prostate: Secondary | ICD-10-CM | POA: Diagnosis not present

## 2022-12-29 DIAGNOSIS — C61 Malignant neoplasm of prostate: Secondary | ICD-10-CM | POA: Diagnosis not present

## 2023-01-30 ENCOUNTER — Ambulatory Visit: Payer: Self-pay

## 2023-01-30 ENCOUNTER — Other Ambulatory Visit: Payer: Self-pay | Admitting: Nurse Practitioner

## 2023-01-30 DIAGNOSIS — M79632 Pain in left forearm: Secondary | ICD-10-CM

## 2023-01-30 DIAGNOSIS — M25532 Pain in left wrist: Secondary | ICD-10-CM

## 2023-01-30 DIAGNOSIS — M542 Cervicalgia: Secondary | ICD-10-CM

## 2023-02-13 DIAGNOSIS — G894 Chronic pain syndrome: Secondary | ICD-10-CM | POA: Diagnosis not present

## 2023-02-13 DIAGNOSIS — Z0000 Encounter for general adult medical examination without abnormal findings: Secondary | ICD-10-CM | POA: Diagnosis not present

## 2023-02-13 DIAGNOSIS — I10 Essential (primary) hypertension: Secondary | ICD-10-CM | POA: Diagnosis not present

## 2023-02-13 DIAGNOSIS — E7800 Pure hypercholesterolemia, unspecified: Secondary | ICD-10-CM | POA: Diagnosis not present

## 2023-03-17 DIAGNOSIS — E7800 Pure hypercholesterolemia, unspecified: Secondary | ICD-10-CM | POA: Diagnosis not present

## 2023-10-16 ENCOUNTER — Other Ambulatory Visit (HOSPITAL_COMMUNITY): Payer: Self-pay | Admitting: Urology

## 2023-10-16 DIAGNOSIS — R9721 Rising PSA following treatment for malignant neoplasm of prostate: Secondary | ICD-10-CM

## 2023-10-25 ENCOUNTER — Encounter (HOSPITAL_COMMUNITY)
Admission: RE | Admit: 2023-10-25 | Discharge: 2023-10-25 | Disposition: A | Discharge status: Home / Self Care | Source: Ambulatory Visit | Attending: Urology | Admitting: Urology

## 2023-10-25 DIAGNOSIS — R9721 Rising PSA following treatment for malignant neoplasm of prostate: Secondary | ICD-10-CM | POA: Diagnosis present

## 2023-10-25 MED ORDER — FLOTUFOLASTAT F 18 GALLIUM 296-5846 MBQ/ML IV SOLN
8.0000 | Freq: Once | INTRAVENOUS | Status: AC
  Administered 2023-10-25: 8 via INTRAVENOUS
  Filled 2023-10-25: qty 8
